# Patient Record
Sex: Male | Born: 1970 | Race: Black or African American | Hispanic: No | Marital: Single | Smoking: Former smoker
Health system: Southern US, Community
[De-identification: ages and names within clinical notes are randomized; demographics above are authoritative.]

---

## 2007-03-13 ENCOUNTER — Emergency Department (HOSPITAL_COMMUNITY): Admission: EM | Admit: 2007-03-13 | Discharge: 2007-03-13 | Payer: Self-pay | Admitting: Emergency Medicine

## 2010-08-30 ENCOUNTER — Emergency Department (HOSPITAL_COMMUNITY)
Admission: EM | Admit: 2010-08-30 | Discharge: 2010-08-30 | Disposition: A | Discharge status: Home / Self Care | Payer: Self-pay | Attending: Emergency Medicine | Admitting: Emergency Medicine

## 2010-08-30 DIAGNOSIS — L299 Pruritus, unspecified: Secondary | ICD-10-CM | POA: Insufficient documentation

## 2010-08-30 DIAGNOSIS — B86 Scabies: Secondary | ICD-10-CM | POA: Insufficient documentation

## 2012-08-14 ENCOUNTER — Encounter (HOSPITAL_COMMUNITY)
Admission: EM | Disposition: A | Discharge status: Home / Self Care | Payer: Self-pay | Source: Home / Self Care | Attending: Emergency Medicine

## 2012-08-14 ENCOUNTER — Observation Stay (HOSPITAL_COMMUNITY)
Admission: EM | Admit: 2012-08-14 | Discharge: 2012-08-15 | Disposition: A | Discharge status: Home / Self Care | Payer: MEDICAID | Attending: Orthopedic Surgery | Admitting: Orthopedic Surgery

## 2012-08-14 ENCOUNTER — Emergency Department (HOSPITAL_COMMUNITY): Payer: Self-pay

## 2012-08-14 ENCOUNTER — Encounter (HOSPITAL_COMMUNITY): Payer: Self-pay | Admitting: Emergency Medicine

## 2012-08-14 ENCOUNTER — Emergency Department (HOSPITAL_COMMUNITY): Payer: Self-pay | Admitting: Anesthesiology

## 2012-08-14 ENCOUNTER — Encounter (HOSPITAL_COMMUNITY): Payer: Self-pay | Admitting: Anesthesiology

## 2012-08-14 DIAGNOSIS — X500XXA Overexertion from strenuous movement or load, initial encounter: Secondary | ICD-10-CM | POA: Insufficient documentation

## 2012-08-14 DIAGNOSIS — S82842A Displaced bimalleolar fracture of left lower leg, initial encounter for closed fracture: Secondary | ICD-10-CM

## 2012-08-14 DIAGNOSIS — S82843A Displaced bimalleolar fracture of unspecified lower leg, initial encounter for closed fracture: Principal | ICD-10-CM

## 2012-08-14 HISTORY — PX: ORIF ANKLE FRACTURE: SHX5408

## 2012-08-14 LAB — CBC WITH DIFFERENTIAL/PLATELET
Basophils Absolute: 0 10*3/uL (ref 0.0–0.1)
Lymphocytes Relative: 16 % (ref 12–46)
Lymphs Abs: 1.4 10*3/uL (ref 0.7–4.0)
Neutrophils Relative %: 74 % (ref 43–77)
Platelets: 158 10*3/uL (ref 150–400)
RBC: 4.67 MIL/uL (ref 4.22–5.81)
WBC: 9.2 10*3/uL (ref 4.0–10.5)

## 2012-08-14 LAB — COMPREHENSIVE METABOLIC PANEL
ALT: 14 U/L (ref 0–53)
AST: 18 U/L (ref 0–37)
Alkaline Phosphatase: 48 U/L (ref 39–117)
CO2: 27 mEq/L (ref 19–32)
GFR calc Af Amer: >90 mL/min (ref >90)
Glucose, Bld: 89 mg/dL (ref 70–99)
Potassium: 3.4 mEq/L — ABNORMAL LOW (ref 3.5–5.1)
Sodium: 140 mEq/L (ref 135–145)
Total Protein: 6.5 g/dL (ref 6.0–8.3)

## 2012-08-14 SURGERY — OPEN REDUCTION INTERNAL FIXATION (ORIF) ANKLE FRACTURE
Anesthesia: General | Laterality: Left | Wound class: Clean

## 2012-08-14 MED ORDER — PROPOFOL 10 MG/ML IV BOLUS
INTRAVENOUS | Status: AC | PRN
  Administered 2012-08-14: 60 mg via INTRAVENOUS
  Administered 2012-08-14: 20 mg via INTRAVENOUS
  Administered 2012-08-14: 100 mg via INTRAVENOUS

## 2012-08-14 MED ORDER — MEPERIDINE HCL 50 MG/ML IJ SOLN
12.5000 mg | Freq: Once | INTRAMUSCULAR | Status: AC
  Administered 2012-08-14: 12.5 mg via INTRAVENOUS

## 2012-08-14 MED ORDER — ENOXAPARIN SODIUM 40 MG/0.4ML ~~LOC~~ SOLN
40.0000 mg | SUBCUTANEOUS | Status: DC
  Administered 2012-08-15: 40 mg via SUBCUTANEOUS
  Filled 2012-08-14 (×2): qty 0.4

## 2012-08-14 MED ORDER — METOCLOPRAMIDE HCL 5 MG/ML IJ SOLN
INTRAMUSCULAR | Status: DC | PRN
  Administered 2012-08-14: 10 mg via INTRAVENOUS

## 2012-08-14 MED ORDER — HYDROMORPHONE HCL PF 1 MG/ML IJ SOLN: 0.2500 mg | INTRAMUSCULAR | Status: DC | PRN

## 2012-08-14 MED ORDER — CEFAZOLIN SODIUM-DEXTROSE 2-3 GM-% IV SOLR
2.0000 g | INTRAVENOUS | Status: AC
  Administered 2012-08-14: 2 g via INTRAVENOUS

## 2012-08-14 MED ORDER — METHOCARBAMOL 500 MG PO TABS
500.0000 mg | ORAL_TABLET | Freq: Four times a day (QID) | ORAL | Status: DC | PRN
  Administered 2012-08-14 – 2012-08-15 (×2): 500 mg via ORAL
  Filled 2012-08-14 (×2): qty 1

## 2012-08-14 MED ORDER — MORPHINE SULFATE 10 MG/ML IJ SOLN
1.0000 mg | INTRAMUSCULAR | Status: DC | PRN
  Administered 2012-08-15: 2 mg via INTRAVENOUS
  Filled 2012-08-14: qty 1

## 2012-08-14 MED ORDER — CITRIC ACID-SODIUM CITRATE 334-500 MG/5ML PO SOLN
ORAL | Status: AC
  Filled 2012-08-14: qty 15

## 2012-08-14 MED ORDER — ONDANSETRON HCL 4 MG/2ML IJ SOLN
INTRAMUSCULAR | Status: DC | PRN
  Administered 2012-08-14 (×2): 2 mg via INTRAVENOUS

## 2012-08-14 MED ORDER — PROPOFOL 10 MG/ML IV BOLUS
INTRAVENOUS | Status: DC | PRN
  Administered 2012-08-14: 180 mg via INTRAVENOUS

## 2012-08-14 MED ORDER — CISATRACURIUM BESYLATE (PF) 10 MG/5ML IV SOLN
INTRAVENOUS | Status: DC | PRN
  Administered 2012-08-14: 4 mg via INTRAVENOUS

## 2012-08-14 MED ORDER — SODIUM CHLORIDE 0.9 % IV SOLN: INTRAVENOUS | Status: DC

## 2012-08-14 MED ORDER — METHOCARBAMOL 100 MG/ML IJ SOLN
500.0000 mg | Freq: Four times a day (QID) | INTRAVENOUS | Status: DC | PRN
  Filled 2012-08-14: qty 5

## 2012-08-14 MED ORDER — FENTANYL CITRATE 0.05 MG/ML IJ SOLN
INTRAMUSCULAR | Status: DC | PRN
  Administered 2012-08-14: 100 ug via INTRAVENOUS
  Administered 2012-08-14: 25 ug via INTRAVENOUS
  Administered 2012-08-14: 50 ug via INTRAVENOUS

## 2012-08-14 MED ORDER — METOCLOPRAMIDE HCL 5 MG PO TABS
5.0000 mg | ORAL_TABLET | Freq: Three times a day (TID) | ORAL | Status: DC | PRN
  Filled 2012-08-14: qty 2

## 2012-08-14 MED ORDER — MIDAZOLAM HCL 5 MG/5ML IJ SOLN
INTRAMUSCULAR | Status: DC | PRN
  Administered 2012-08-14: 2 mg via INTRAVENOUS

## 2012-08-14 MED ORDER — PROMETHAZINE HCL 25 MG/ML IJ SOLN: 6.2500 mg | INTRAMUSCULAR | Status: DC | PRN

## 2012-08-14 MED ORDER — MEPERIDINE HCL 50 MG/ML IJ SOLN
INTRAMUSCULAR | Status: AC
  Filled 2012-08-14: qty 1

## 2012-08-14 MED ORDER — ONDANSETRON HCL 4 MG/2ML IJ SOLN: 4.0000 mg | Freq: Four times a day (QID) | INTRAMUSCULAR | Status: DC | PRN

## 2012-08-14 MED ORDER — PROPOFOL 10 MG/ML IV BOLUS
200.0000 mg | Freq: Once | INTRAVENOUS | Status: AC
  Administered 2012-08-14: 200 mg via INTRAVENOUS
  Filled 2012-08-14: qty 1

## 2012-08-14 MED ORDER — TEMAZEPAM 15 MG PO CAPS: 15.0000 mg | ORAL_CAPSULE | Freq: Every evening | ORAL | Status: DC | PRN

## 2012-08-14 MED ORDER — SUCCINYLCHOLINE CHLORIDE 20 MG/ML IJ SOLN
INTRAMUSCULAR | Status: DC | PRN
  Administered 2012-08-14: 100 mg via INTRAVENOUS

## 2012-08-14 MED ORDER — LORAZEPAM 2 MG/ML IJ SOLN: 1.0000 mg | Freq: Four times a day (QID) | INTRAMUSCULAR | Status: DC | PRN

## 2012-08-14 MED ORDER — 0.9 % SODIUM CHLORIDE (POUR BTL) OPTIME
TOPICAL | Status: DC | PRN
  Administered 2012-08-14: 1000 mL

## 2012-08-14 MED ORDER — KETAMINE HCL 10 MG/ML IJ SOLN
INTRAMUSCULAR | Status: DC | PRN
  Administered 2012-08-14: 20 mg via INTRAVENOUS

## 2012-08-14 MED ORDER — CITRIC ACID-SODIUM CITRATE 334-500 MG/5ML PO SOLN
ORAL | Status: DC | PRN
  Administered 2012-08-14: 30 mL via ORAL

## 2012-08-14 MED ORDER — LACTATED RINGERS IV SOLN
INTRAVENOUS | Status: DC | PRN
  Administered 2012-08-14 (×2): via INTRAVENOUS

## 2012-08-14 MED ORDER — CEFAZOLIN SODIUM 1-5 GM-% IV SOLN
1.0000 g | Freq: Four times a day (QID) | INTRAVENOUS | Status: AC
  Administered 2012-08-15 (×3): 1 g via INTRAVENOUS
  Filled 2012-08-14 (×3): qty 50

## 2012-08-14 MED ORDER — FENTANYL CITRATE 0.05 MG/ML IJ SOLN: 100.0000 ug | Freq: Once | INTRAMUSCULAR | Status: DC

## 2012-08-14 MED ORDER — OXYCODONE HCL 5 MG PO TABS
5.0000 mg | ORAL_TABLET | ORAL | Status: DC | PRN
  Administered 2012-08-14 – 2012-08-15 (×3): 10 mg via ORAL
  Filled 2012-08-14 (×3): qty 2

## 2012-08-14 MED ORDER — METOCLOPRAMIDE HCL 5 MG/ML IJ SOLN: 5.0000 mg | Freq: Three times a day (TID) | INTRAMUSCULAR | Status: DC | PRN

## 2012-08-14 MED ORDER — ONDANSETRON HCL 4 MG PO TABS
4.0000 mg | ORAL_TABLET | Freq: Four times a day (QID) | ORAL | Status: DC | PRN
  Filled 2012-08-14: qty 1

## 2012-08-14 MED ORDER — CEFAZOLIN SODIUM-DEXTROSE 2-3 GM-% IV SOLR
INTRAVENOUS | Status: AC
  Filled 2012-08-14: qty 50

## 2012-08-14 SURGICAL SUPPLY — 45 items
BAG ZIPLOCK 12X15 (MISCELLANEOUS) ×2 IMPLANT
BANDAGE ELASTIC 4 VELCRO ST LF (GAUZE/BANDAGES/DRESSINGS) ×2 IMPLANT
BANDAGE ELASTIC 6 VELCRO ST LF (GAUZE/BANDAGES/DRESSINGS) ×2 IMPLANT
BIT DRILL 2.5X2.75 QC CALB (BIT) ×2 IMPLANT
BIT DRILL 3.5X5.5 QC CALB (BIT) ×2 IMPLANT
CLOTH BEACON ORANGE TIMEOUT ST (SAFETY) ×2 IMPLANT
CUFF TOURN SGL QUICK 34 (TOURNIQUET CUFF) ×1
CUFF TRNQT CYL 34X4X40X1 (TOURNIQUET CUFF) ×1 IMPLANT
DRAPE C-ARM 42X120 X-RAY (DRAPES) ×2 IMPLANT
DRAPE U-SHAPE 47X51 STRL (DRAPES) ×2 IMPLANT
DRSG ADAPTIC 3X8 NADH LF (GAUZE/BANDAGES/DRESSINGS) ×2 IMPLANT
DRSG EMULSION OIL 3X16 NADH (GAUZE/BANDAGES/DRESSINGS) ×2 IMPLANT
DRSG PAD ABDOMINAL 8X10 ST (GAUZE/BANDAGES/DRESSINGS) ×2 IMPLANT
DURAPREP 26ML APPLICATOR (WOUND CARE) ×2 IMPLANT
ELECT REM PT RETURN 9FT ADLT (ELECTROSURGICAL) ×2
ELECTRODE REM PT RTRN 9FT ADLT (ELECTROSURGICAL) ×1 IMPLANT
GLOVE BIO SURGEON STRL SZ7.5 (GLOVE) ×2 IMPLANT
GLOVE BIO SURGEON STRL SZ8 (GLOVE) ×2 IMPLANT
GOWN STRL NON-REIN LRG LVL3 (GOWN DISPOSABLE) ×2 IMPLANT
GOWN STRL REIN XL XLG (GOWN DISPOSABLE) ×2 IMPLANT
K-WIRE 1.6MM THD TROCAR TIP NS (WIRE) ×4
KIT BASIN OR (CUSTOM PROCEDURE TRAY) ×2 IMPLANT
KWIRE 1.6MM THD TROCAR TIP NS (WIRE) ×2 IMPLANT
MANIFOLD NEPTUNE II (INSTRUMENTS) ×2 IMPLANT
NS IRRIG 1000ML POUR BTL (IV SOLUTION) ×2 IMPLANT
PACK LOWER EXTREMITY WL (CUSTOM PROCEDURE TRAY) ×2 IMPLANT
PAD CAST 4YDX4 CTTN HI CHSV (CAST SUPPLIES) ×1 IMPLANT
PADDING CAST COTTON 4X4 STRL (CAST SUPPLIES) ×1
PLATE ACE 100DEG 7HOLE (Plate) ×2 IMPLANT
POSITIONER SURGICAL ARM (MISCELLANEOUS) ×2 IMPLANT
SCREW ACE CAN 4.0 50M (Screw) ×4 IMPLANT
SCREW CORTICAL 3.5MM  12MM (Screw) ×3 IMPLANT
SCREW CORTICAL 3.5MM 12MM (Screw) ×3 IMPLANT
SCREW CORTICAL 3.5MM 14MM (Screw) ×2 IMPLANT
SCREW NLOCK CANC HEX 4X18 (Screw) ×2 IMPLANT
SCREW NLOCK CANC HEX 4X18 FIB (Screw) ×2 IMPLANT
SPONGE GAUZE 4X4 12PLY (GAUZE/BANDAGES/DRESSINGS) ×2 IMPLANT
STAPLER VISISTAT 35W (STAPLE) ×2 IMPLANT
STRIP CLOSURE SKIN 1/2X4 (GAUZE/BANDAGES/DRESSINGS) ×2 IMPLANT
SUT MNCRL AB 4-0 PS2 18 (SUTURE) ×2 IMPLANT
SUT VIC AB 1 CT1 27 (SUTURE) ×2
SUT VIC AB 1 CT1 27XBRD ANTBC (SUTURE) ×2 IMPLANT
SUT VIC AB 2-0 CT1 27 (SUTURE) ×2
SUT VIC AB 2-0 CT1 TAPERPNT 27 (SUTURE) ×2 IMPLANT
TOWEL OR 17X26 10 PK STRL BLUE (TOWEL DISPOSABLE) ×4 IMPLANT

## 2012-08-14 NOTE — H&P (Signed)
Jared Romero is an 42 y.o. male.   Chief Complaint: Left ankle pain HPI: 42 yo male who was "messing around" in his yard earlier today when he slipped and fell twisting his left ankle with immediate left ankle pain and deformity. No numbness or tingling. Unable to get up and bear weight. Was drinking alcohol earlier today- "a couple of beers and some wine and gin". He is not hurting anywhere besides his ankle.He has had a previous injury to this ankle and was told it was "broke but not fractured" . He did not require surgery with that last injury. He presented with a fracture dislocation, which Dr. Ranae Palms reduced but there was still residual subluxation. I was called to see patient for possible surgical fixation.  History reviewed. No pertinent past medical history.  History reviewed. No pertinent past surgical history.  No family history on file. Social History:  reports that he has been smoking.  He does not have any smokeless tobacco history on file. He reports that  drinks alcohol. His drug history is not on file.  Allergies: No Known Allergies   (Not in a hospital admission)  No results found for this or any previous visit (from the past 48 hour(s)). Dg Ankle Complete Left  08/14/2012   *RADIOLOGY REPORT*  Clinical Data: Fracture dislocation of the left ankle.  LEFT ANKLE COMPLETE - 3+ VIEW  Comparison: Radiographs dated 08/14/2012  Findings: There has been reduction of the dislocation at the ankle. There is slight displacement of the medial and lateral malleolar fractures and there is slight lateral subluxation of the foot with respect to the distal tibia.  Medial joint space is widened at the ankle.  IMPRESSION: Reduction of the dislocation.  Improved alignment and position of the medial and lateral malleolar fractures as described.   Original Report Authenticated By: Francene Boyers, M.D.   Dg Ankle Complete Left  08/14/2012   *RADIOLOGY REPORT*  Clinical Data: Trauma with deformity  LEFT  ANKLE COMPLETE - 3+ VIEW  Comparison: None.  Findings: There is extensive fracture dislocation at the ankle joint.  There is complete dislocation of the articular surface of the tibia from the talus.  The talus is displaced laterally.  There is a fracture of the medial malleolus.  There is probably a fracture of the posterior lip of the tibia.  There is an oblique fracture of the distal fibula.  IMPRESSION: Fracture dislocation at the ankle joint as described.   Original Report Authenticated By: Paulina Fusi, M.D.    Review of Systems  Constitutional: Negative.   HENT: Negative.   Eyes: Negative.   Respiratory: Negative.   Cardiovascular: Negative.   Gastrointestinal: Negative.   Genitourinary: Negative.   Musculoskeletal: Positive for joint pain.       Left ankle pain   Skin: Negative.   Neurological: Negative.   Endo/Heme/Allergies: Negative.   Psychiatric/Behavioral: Negative.     Blood pressure 121/59, pulse 95, temperature 99.6 F (37.6 C), temperature source Oral, resp. rate 21, height 5\' 8"  (1.727 m), weight 165 lb (74.844 kg), SpO2 98.00%. Physical Exam  Physical Examination: General appearance - alert, well appearing, and in no distress Mental status - alert, oriented to person, place, and time Neck - supple, no significant adenopathy Chest - clear to auscultation, no wheezes, rales or rhonchi, symmetric air entry Heart - normal rate, regular rhythm, normal S1, S2, no murmurs, rubs, clicks or gallops Abdomen - soft, nontender, nondistended, no masses or organomegaly Neurological - alert, oriented, normal  speech, no focal findings or movement disorder noted Left lower extremity in short leg splint- wiggles toes, sensation intact, Cap refill , 3 secs  Assessment/Plan Left bimalleolar ankle fracture/dislocation- Plan ORIF as this is an unstable injury with an incongruent joint. Discussed procedure,risks, potential complications and rehab course with patient who elects to  proceed.  Loanne Drilling 08/14/2012, 4:38 PM

## 2012-08-14 NOTE — ED Notes (Signed)
Per ems: pt from home, was drinking alcohol in his yard today, stepped into hole and twisted left ankle. Left ankle deformity noted. Pt does not appear intoxicated. No LOC.18 gauge placed in left ac. bp 132/82, pulse 84, GCS 15.

## 2012-08-14 NOTE — ED Provider Notes (Signed)
CSN: [id number]     Arrival date & time 08/14/12  1322 History     First MD Initiated Contact with Patient 08/14/12 1325     Chief Complaint  Patient presents with  . Leg Injury    ankle   (Consider location/radiation/quality/duration/timing/severity/associated sxs/prior Treatment) HPI Pt with L ankle injury requiring surgery 1 year ago after stepping in a hole. Pt now states the ankle must not have healed properly because he stepped into another hole and his ankle is now severely deformed. Pt denies other injury. No pain currently. Pt has no numbness. Splint placed by EMS. Pt last ate yesterday and drank small amount earlier this AM.  History reviewed. No pertinent past medical history. Past Surgical History  Procedure Laterality Date  . Orif ankle fracture Left 08/14/2012    Procedure: OPEN REDUCTION INTERNAL FIXATION (ORIF) ANKLE FRACTURE;  Surgeon: Loanne Drilling, MD;  Location: [facility];  Service: Orthopedics;  Laterality: Left;   No family history on file. History  Substance Use Topics  . Smoking status: Current Every Day Smoker  . Smokeless tobacco: Not on file  . Alcohol Use: Yes    Review of Systems  HENT: Negative for neck pain.   Cardiovascular: Negative for chest pain.  Gastrointestinal: Negative for nausea, vomiting and abdominal pain.  Musculoskeletal: Positive for joint swelling.  Neurological: Negative for dizziness, weakness, numbness and headaches.  All other systems reviewed and are negative.    Allergies  Review of patient's allergies indicates no known allergies.  Home Medications   Current Outpatient Rx  Name  Route  Sig  Dispense  Refill  . methocarbamol (ROBAXIN) 500 MG tablet   Oral   Take 1 tablet (500 mg total) by mouth every 6 (six) hours as needed.   80 tablet   0   . oxyCODONE (OXY IR/ROXICODONE) 5 MG immediate release tablet   Oral   Take 1-2 tablets (5-10 mg total) by mouth every 3 (three) hours as needed.   80 tablet   0    BP  151/106  Pulse 78  Temp(Src) 98 F (36.7 C) (Oral)  Resp 20  Ht 5\' 8"  (1.727 m)  Wt 165 lb (74.844 kg)  BMI 25.09 kg/m2  SpO2 56% Physical Exam  Nursing note and vitals reviewed. Constitutional: He is oriented to person, place, and time. He appears well-developed and well-nourished. No distress.  HENT:  Head: Normocephalic and atraumatic.  Mouth/Throat: Oropharynx is clear and moist.  Eyes: EOM are normal. Pupils are equal, round, and reactive to light.  Neck: Normal range of motion. Neck supple.  No posterior cervical tenderness  Cardiovascular: Normal rate and regular rhythm.   Pulmonary/Chest: Effort normal and breath sounds normal. No respiratory distress. He has no wheezes. He has no rales.  Abdominal: Soft. Bowel sounds are normal. There is no tenderness. There is no rebound and no guarding.  Musculoskeletal: He exhibits no edema and no tenderness.  Grossly deformed L ankle with foot angled 90 degrees laterally. 2+ DP, foot warm. No prox fib tenderness or knee pain  Neurological: He is alert and oriented to person, place, and time.  Sensation intact, moves all toes  Skin: Skin is warm and dry. No rash noted. No erythema.  Psychiatric: He has a normal mood and affect. His behavior is normal.    ED Course   Reduction of fracture Date/Time: 08/14/2012 3:25 PM Performed by: Loren Racer Authorized by: Ranae Palms, Kaileah Shevchenko Consent: written consent obtained. Risks and benefits: risks, benefits  and alternatives were discussed Consent given by: patient Imaging studies: imaging studies available Patient identity confirmed: verbally with patient and arm band Time out: Immediately prior to procedure a "time out" was called to verify the correct patient, procedure, equipment, support staff and site/side marked as required. Local anesthesia used: no Patient sedated: yes Sedatives: propofol and see MAR for details Sedation start date/time: 08/14/2012 3:00 PM Sedation end date/time:  08/14/2012 3:15 PM Patient tolerance: Patient tolerated the procedure well with no immediate complications. Comments: Good alignment. 2+ DP. Good cap refill.      (including critical care time)  Labs Reviewed  COMPREHENSIVE METABOLIC PANEL - Abnormal; Notable for the following:    Potassium 3.4 (*)    Calcium 8.3 (*)    Albumin 3.2 (*)    Total Bilirubin 0.2 (*)    All other components within normal limits  CBC WITH DIFFERENTIAL   Dg Ankle Complete Left  08/14/2012   *RADIOLOGY REPORT*  Clinical Data: ORIF of the left ankle  DG C-ARM 1-60 MIN - NRPT [facility], LEFT ANKLE COMPLETE - 3+ VIEW  Comparison:  Left ankle radiographs - 08/14/2012  Findings:  Three spot intraoperative radiographic images of the left ankle are provided for review.  Images demonstrate lag screw fixation of previously noted medial malleolar fracture.  Post side plate and transfixing cancellous screw fixation of previously noted displaced distal fibular fracture.  Alignment appears near anatomic.  There is a minimal amount of expected subcutaneous emphysema about the operative site.  No radiopaque foreign body.  IMPRESSION: Post ORIF of medial and lateral malleolar ankle fractures without evidence of complication.   Original Report Authenticated By: Tacey Ruiz, MD   Dg Ankle Complete Left  08/14/2012   *RADIOLOGY REPORT*  Clinical Data: Fracture dislocation of the left ankle.  LEFT ANKLE COMPLETE - 3+ VIEW  Comparison: Radiographs dated 08/14/2012  Findings: There has been reduction of the dislocation at the ankle. There is slight displacement of the medial and lateral malleolar fractures and there is slight lateral subluxation of the foot with respect to the distal tibia.  Medial joint space is widened at the ankle.  IMPRESSION: Reduction of the dislocation.  Improved alignment and position of the medial and lateral malleolar fractures as described.   Original Report Authenticated By: Francene Boyers, M.D.   Dg C-arm 1-60 Min-no  Report  08/14/2012   *RADIOLOGY REPORT*  Clinical Data: ORIF of the left ankle  DG C-ARM 1-60 MIN - [facility], LEFT ANKLE COMPLETE - 3+ VIEW  Comparison:  Left ankle radiographs - 08/14/2012  Findings:  Three spot intraoperative radiographic images of the left ankle are provided for review.  Images demonstrate lag screw fixation of previously noted medial malleolar fracture.  Post side plate and transfixing cancellous screw fixation of previously noted displaced distal fibular fracture.  Alignment appears near anatomic.  There is a minimal amount of expected subcutaneous emphysema about the operative site.  No radiopaque foreign body.  IMPRESSION: Post ORIF of medial and lateral malleolar ankle fractures without evidence of complication.   Original Report Authenticated By: Tacey Ruiz, MD   1. Fracture of ankle, bimalleolar, closed, left, initial encounter     MDM  Discussed With Dr Lequita Halt. Call back if not successfully reduced.   Dr Lequita Halt to see in ED. Keep NPO.   Loren Racer, MD 08/16/12 [phone]

## 2012-08-14 NOTE — Brief Op Note (Signed)
08/14/2012  6:59 PM  PATIENT:  Jared Romero  42 y.o. male  PRE-OPERATIVE DIAGNOSIS:  left ankle fracture  POST-OPERATIVE DIAGNOSIS:  left ankle fracture  PROCEDURE:  Procedure(s): OPEN REDUCTION INTERNAL FIXATION (ORIF) ANKLE FRACTURE (Left)  SURGEON:  Surgeon(s) and Role:    * Loanne Drilling, MD - Primary  PHYSICIAN ASSISTANT:   ASSISTANTS: Avel Peace, PA-C   ANESTHESIA:   general  EBL:     LOCAL MEDICATIONS USED:  NONE  COUNTS:  YES  TOURNIQUET:   Total Tourniquet Time Documented: Thigh (Left) - 28 minutes Total: Thigh (Left) - 28 minutes   DICTATION: .Other Dictation: Dictation Number [id number]  PLAN OF CARE: Admit for overnight observation  PATIENT DISPOSITION:  PACU - hemodynamically stable.

## 2012-08-14 NOTE — Transfer of Care (Signed)
Immediate Anesthesia Transfer of Care Note  Patient: Jared Romero  Procedure(s) Performed: Procedure(s): OPEN REDUCTION INTERNAL FIXATION (ORIF) ANKLE FRACTURE (Left)  Patient Location: PACU  Anesthesia Type:General  Level of Consciousness: awake, alert , oriented and patient cooperative  Airway & Oxygen Therapy: Patient Spontanous Breathing and Patient connected to face mask oxygen  Post-op Assessment: Report given to PACU RN and Post -op Vital signs reviewed and stable  Post vital signs: Reviewed and stable  Complications: No apparent anesthesia complications

## 2012-08-14 NOTE — Interval H&P Note (Signed)
History and Physical Interval Note:  08/14/2012 6:07 PM  Jared Romero  has presented today for surgery, with the diagnosis of left ankle fracture  The various methods of treatment have been discussed with the patient and family. After consideration of risks, benefits and other options for treatment, the patient has consented to  Procedure(s): OPEN REDUCTION INTERNAL FIXATION (ORIF) ANKLE FRACTURE (Left) as a surgical intervention .  The patient's history has been reviewed, patient examined, no change in status, stable for surgery.  I have reviewed the patient's chart and labs.  Questions were answered to the patient's satisfaction.     Loanne Drilling

## 2012-08-14 NOTE — Anesthesia Preprocedure Evaluation (Signed)
Anesthesia Evaluation  Patient identified by MRN, date of birth, ID band Patient awake    Reviewed: Allergy & Precautions, H&P , NPO status , Patient's Chart, lab work & pertinent test results  Airway Mallampati: II TM Distance: >3 FB Neck ROM: Full    Dental no notable dental hx.    Pulmonary neg pulmonary ROS, Current Smoker,  breath sounds clear to auscultation  Pulmonary exam normal       Cardiovascular Exercise Tolerance: Good negative cardio ROS  Rhythm:Regular Rate:Normal     Neuro/Psych negative neurological ROS  negative psych ROS   GI/Hepatic negative GI ROS, Neg liver ROS,   Endo/Other  negative endocrine ROS  Renal/GU negative Renal ROS  negative genitourinary   Musculoskeletal negative musculoskeletal ROS (+)   Abdominal   Peds negative pediatric ROS (+)  Hematology negative hematology ROS (+)   Anesthesia Other Findings   Reproductive/Obstetrics negative OB ROS                           Anesthesia Physical Anesthesia Plan  ASA: II and emergent  Anesthesia Plan: General   Post-op Pain Management:    Induction: Intravenous  Airway Management Planned: Oral ETT  Additional Equipment:   Intra-op Plan:   Post-operative Plan: Extubation in OR  Informed Consent: I have reviewed the patients History and Physical, chart, labs and discussed the procedure including the risks, benefits and alternatives for the proposed anesthesia with the patient or authorized representative who has indicated his/her understanding and acceptance.   Dental advisory given  Plan Discussed with: CRNA  Anesthesia Plan Comments:         Anesthesia Quick Evaluation

## 2012-08-14 NOTE — Anesthesia Postprocedure Evaluation (Signed)
  Anesthesia Post-op Note  Patient: Jared Romero  Procedure(s) Performed: Procedure(s) (LRB): OPEN REDUCTION INTERNAL FIXATION (ORIF) ANKLE FRACTURE (Left)  Patient Location: PACU  Anesthesia Type: General  Level of Consciousness: awake and alert   Airway and Oxygen Therapy: Patient Spontanous Breathing  Post-op Pain: mild  Post-op Assessment: Post-op Vital signs reviewed, Patient's Cardiovascular Status Stable, Respiratory Function Stable, Patent Airway and No signs of Nausea or vomiting  Last Vitals:  Filed Vitals:   08/14/12 2054  BP: 133/70  Pulse: 94  Temp: 38 C  Resp: 20    Post-op Vital Signs: stable   Complications: No apparent anesthesia complications

## 2012-08-14 NOTE — ED Notes (Signed)
[id number]<ON> Expected date:<BR> Expected time:<BR> Means of arrival:<BR> Comments:<BR> EMS ankle deformity

## 2012-08-15 MED ORDER — MORPHINE SULFATE 2 MG/ML IJ SOLN: 1.0000 mg | INTRAMUSCULAR | Status: DC | PRN

## 2012-08-15 MED ORDER — METHOCARBAMOL 500 MG PO TABS: 500.0000 mg | ORAL_TABLET | Freq: Four times a day (QID) | ORAL | Status: DC | PRN

## 2012-08-15 MED ORDER — OXYCODONE HCL 5 MG PO TABS: 5.0000 mg | ORAL_TABLET | ORAL | Status: DC | PRN

## 2012-08-15 NOTE — Evaluation (Signed)
Physical Therapy Evaluation Patient Details Name: Jared Romero MRN: [id number] DOB: 30-Aug-1970 Today's Date: 08/15/2012 Time: 7829-5621 PT Time Calculation (min): 22 min  PT Assessment / Plan / Recommendation History of Present Illness  42 yo male admitted with fx left ankle who underwent ORIF 08/14/12  Pt is TDWB  Clinical Impression  Pt is independent with RW on level surfaces and supervision on steps to enter home. He is more comfortable walking and maintaining limited weight bearing with RW and can borrow one from his father at home.  Crutches had been issued and were adjusted for pt to use once his weight bearing status is increased. Pt acknowledged understanding of all teaching and had no further questions.    PT Assessment  Patent does not need any further PT services    Follow Up Recommendations  No PT follow up    Does the patient have the potential to tolerate intense rehabilitation      Barriers to Discharge        Equipment Recommendations  None recommended by PT    Recommendations for Other Services     Frequency      Precautions / Restrictions Restrictions Weight Bearing Restrictions: Yes RLE Weight Bearing: Touchdown weight bearing (L LE) LLE Weight Bearing: Touchdown weight bearing Other Position/Activity Restrictions: Pt is in a CAM boot for immobilization   Pertinent Vitals/Pain Pt with some pain in left ankle. He had received pain med prior to treatment      Mobility  Bed Mobility Bed Mobility: Supine to Sit Supine to Sit: 6: Modified independent (Device/Increase time) Details for Bed Mobility Assistance: pt moves slowly Transfers Transfers: Sit to Stand;Stand to Sit Sit to Stand: 6: Modified independent (Device/Increase time) Stand to Sit: 6: Modified independent (Device/Increase time) Details for Transfer Assistance: cues to push up with arms, pt moves slowly Ambulation/Gait Ambulation/Gait Assistance: 6: Modified independent (Device/Increase  time) Ambulation Distance (Feet): 50 Feet Assistive device: Rolling walker;Crutches Ambulation/Gait Assistance Details: cues for posture.   Gait Pattern: Step-to pattern Gait velocity: decreased General Gait Details: Pt prefers to use RW because he feels safer.  He is able to safely limit his weight bearing to TDWB and even NWB today Stairs: Yes Stairs Assistance: 5: Supervision Stairs Assistance Details (indicate cue type and reason): supervision for safety Stair Management Technique: No rails;Backwards;With walker Number of Stairs: 2    Exercises     PT Diagnosis:    PT Problem List:   PT Treatment Interventions:       PT Goals(Current goals can be found in the care plan section) Acute Rehab PT Goals Patient Stated Goal: to go home today PT Goal Formulation: No goals set, d/c therapy  Visit Information  Last PT Received On: 08/15/12 Assistance Needed: +1 History of Present Illness: 42 yo male admitted with fx left ankle who underwent ORIF 08/14/12  Pt is TDWB       Prior Functioning  Home Living Family/patient expects to be discharged to:: Private residence Living Arrangements: Spouse/significant other Available Help at Discharge: Family Type of Home: House Home Access: Stairs to enter Secretary/administrator of Steps: 1 Entrance Stairs-Rails: None Home Layout: One level Home Equipment: Environmental consultant - 2 wheels Additional Comments: Pt is able to borrow his father's RW Prior Function Level of Independence: Independent Communication Communication: No difficulties    Cognition  Cognition Arousal/Alertness: Awake/alert Behavior During Therapy: WFL for tasks assessed/performed Overall Cognitive Status: Within Functional Limits for tasks assessed    Extremity/Trunk Assessment Upper Extremity Assessment  Upper Extremity Assessment: Overall WFL for tasks assessed Lower Extremity Assessment Lower Extremity Assessment: LLE deficits/detail LLE Deficits / Details: cam boot on  lower legs. Toes warm and moveable but feel like they are "tingling" Cervical / Trunk Assessment Cervical / Trunk Assessment: Normal   Balance Balance Balance Assessed: Yes Static Sitting Balance Static Sitting - Balance Support: No upper extremity supported Static Sitting - Level of Assistance: 7: Independent Static Standing Balance Static Standing - Balance Support: Bilateral upper extremity supported Static Standing - Level of Assistance: 6: Modified independent (Device/Increase time)  End of Session PT - End of Session Activity Tolerance: Patient tolerated treatment well Patient left: in chair;with family/visitor present Nurse Communication: Mobility status  GP Functional Assessment Tool Used: clinical judgement Functional Limitation: Mobility: Walking and moving around Mobility: Walking and Moving Around Current Status [phone]): At least 1 percent but less than 20 percent impaired, limited or restricted Mobility: Walking and Moving Around Goal Status [phone]): At least 1 percent but less than 20 percent impaired, limited or restricted Mobility: Walking and Moving Around Discharge Status [phone]): At least 1 percent but less than 20 percent impaired, limited or restricted    Jared Romero, [place] [phone] 08/15/2012, 11:56 AM

## 2012-08-15 NOTE — Care Management Note (Signed)
    Page 1 of 1   08/15/2012     3:24:48 PM   CARE MANAGEMENT NOTE 08/15/2012  Patient:  Jared Romero, Jared Romero   Account Number:  [id number]  Date Initiated:  08/15/2012  Documentation initiated by:  Lorenda Ishihara  Subjective/Objective Assessment:   42 yo male admitted s/p ORIF of ankle. PTA lived at home with spouse.     Action/Plan:   Home when stable   Anticipated DC Date:  08/15/2012   Anticipated DC Plan:  HOME/SELF CARE      DC Planning Services  CM consult      Choice offered to / List presented to:             Status of service:  Completed, signed off Medicare Important Message given?   (If response is "NO", the following Medicare IM given date fields will be blank) Date Medicare IM given:   Date Additional Medicare IM given:    Discharge Disposition:  HOME/SELF CARE  Per UR Regulation:  Reviewed for med. necessity/level of care/duration of stay  If discussed at Long Length of Stay Meetings, dates discussed:    Comments:  No HH needs per PT eval

## 2012-08-15 NOTE — Progress Notes (Signed)
   Subjective: 1 Day Post-Op Procedure(s) (LRB): OPEN REDUCTION INTERNAL FIXATION (ORIF) ANKLE FRACTURE (Left) Patient reports pain as mild.   Patient seen in rounds for Dr. Lequita Halt. Patient is well, but has had some minor complaints of pain in the ankle, requiring pain medications Patient is ready to go home later today.  Objective: Vital signs in last 24 hours: Temp:  [99.2 F (37.3 C)-100.9 F (38.3 C)] 99.2 F (37.3 C) (07/28 0500) Pulse Rate:  [68-118] 68 (07/28 0500) Resp:  [18-31] 18 (07/28 0500) BP: (111-140)/(50-100) 130/83 mmHg (07/28 0500) SpO2:  [92 %-100 %] 96 % (07/28 0500) Weight:  [72.576 kg (160 lb)-74.844 kg (165 lb)] 74.844 kg (165 lb) (07/27 2100)  Intake/Output from previous day:  Intake/Output Summary (Last 24 hours) at 08/15/12 0704 Last data filed at 08/15/12 0600  Gross per 24 hour  Intake   2350 ml  Output   1725 ml  Net    625 ml    Intake/Output this shift:    Labs:  Recent Labs  08/14/12 1715  HGB 13.4    Recent Labs  08/14/12 1715  WBC 9.2  RBC 4.67  HCT 40.6  PLT 158    Recent Labs  08/14/12 1715  NA 140  K 3.4*  CL 105  CO2 27  BUN 8  CREATININE 0.99  GLUCOSE 89  CALCIUM 8.3*   No results found for this basename: LABPT, INR,  in the last 72 hours  EXAM: General - Patient is Alert and Appropriate Extremity - Neurovascular intact Sensation intact distally Dressing - clean, dry Motor Function - intact, moving toes well on exam.  CAM walker in place  Assessment/Plan: 1 Day Post-Op Procedure(s) (LRB): OPEN REDUCTION INTERNAL FIXATION (ORIF) ANKLE FRACTURE (Left) Procedure(s) (LRB): OPEN REDUCTION INTERNAL FIXATION (ORIF) ANKLE FRACTURE (Left) History reviewed. No pertinent past medical history. Principal Problem:   Fracture of ankle, bimalleolar, closed  Estimated body mass index is 25.09 kg/(m^2) as calculated from the following:   Height as of this encounter: 5\' 8"  (1.727 m).   Weight as of this encounter:  74.844 kg (165 lb). Up with therapy Discharge home with home health Diet - Regular diet Follow up - in 1 weeks Activity - NWB Disposition - Home Condition Upon Discharge - Stable D/C Meds - See DC Summary   Jared Romero 08/15/2012, 7:04 AM

## 2012-08-15 NOTE — Progress Notes (Signed)
Pt for d/c home today after therapy session. IV d/c'd. Cam walker boot in place to L LE. Instructed to maintain use of ice to help with pain & swelling. D/C instructions & Rx given with verbalized understanding. Wife at bedside to assist with d/c. PT states that pt does not need PT follow up at home & will use father's walker for ambulation.

## 2012-08-15 NOTE — Discharge Summary (Signed)
Physician Discharge Summary   Patient ID: Jared Romero MRN: [id number] DOB/AGE: 40-26-1972 40 y.o.  Admit date: 08/14/2012 Discharge date: 08/15/2012  Primary Diagnosis:Left bimalleolar ankle fracture/dislocation    Admission Diagnoses:  History reviewed. No pertinent past medical history. Discharge Diagnoses:   Principal Problem:   Fracture of ankle, bimalleolar, closed  Estimated body mass index is 25.09 kg/(m^2) as calculated from the following:   Height as of this encounter: 5\' 8"  (1.727 m).   Weight as of this encounter: 74.844 kg (165 lb).  Procedure:  Procedure(s) (LRB): OPEN REDUCTION INTERNAL FIXATION (ORIF) ANKLE FRACTURE (Left)   Consults: None  HPI: 42 yo male who was "messing around" in his yard earlier today when he slipped and fell twisting his left ankle with immediate left ankle pain and deformity. No numbness or tingling. Unable to get up and bear weight. Was drinking alcohol earlier today- "a couple of beers and some wine and gin". He is not hurting anywhere besides his ankle.He has had a previous injury to this ankle and was told it was "broke but not fractured" . He did not require surgery with that last injury. He presented with a fracture dislocation, which Dr. Ranae Palms reduced but there was still residual subluxation. Dr. Lequita Halt saw the patient and it was decided that he would require surgery.  Laboratory Data: Admission on 08/14/2012  Component Date Value Range Status  . WBC 08/14/2012 9.2  4.0 - 10.5 K/uL Final  . RBC 08/14/2012 4.67  4.22 - 5.81 MIL/uL Final  . Hemoglobin 08/14/2012 13.4  13.0 - 17.0 g/dL Final  . HCT 40/98/1191 40.6  39.0 - 52.0 % Final  . MCV 08/14/2012 86.9  78.0 - 100.0 fL Final  . MCH 08/14/2012 28.7  26.0 - 34.0 pg Final  . MCHC 08/14/2012 33.0  30.0 - 36.0 g/dL Final  . RDW 47/82/9562 14.0  11.5 - 15.5 % Final  . Platelets 08/14/2012 158  150 - 400 K/uL Final  . Neutrophils Relative % 08/14/2012 74  43 - 77 % Final  . Neutro  Abs 08/14/2012 6.8  1.7 - 7.7 K/uL Final  . Lymphocytes Relative 08/14/2012 16  12 - 46 % Final  . Lymphs Abs 08/14/2012 1.4  0.7 - 4.0 K/uL Final  . Monocytes Relative 08/14/2012 10  3 - 12 % Final  . Monocytes Absolute 08/14/2012 0.9  0.1 - 1.0 K/uL Final  . Eosinophils Relative 08/14/2012 1  0 - 5 % Final  . Eosinophils Absolute 08/14/2012 0.1  0.0 - 0.7 K/uL Final  . Basophils Relative 08/14/2012 0  0 - 1 % Final  . Basophils Absolute 08/14/2012 0.0  0.0 - 0.1 K/uL Final  . Sodium 08/14/2012 140  135 - 145 mEq/L Final  . Potassium 08/14/2012 3.4* 3.5 - 5.1 mEq/L Final  . Chloride 08/14/2012 105  96 - 112 mEq/L Final  . CO2 08/14/2012 27  19 - 32 mEq/L Final  . Glucose, Bld 08/14/2012 89  70 - 99 mg/dL Final  . BUN 13/08/6576 8  6 - 23 mg/dL Final  . Creatinine, Ser 08/14/2012 0.99  0.50 - 1.35 mg/dL Final  . Calcium 46/96/2952 8.3* 8.4 - 10.5 mg/dL Final  . Total Protein 08/14/2012 6.5  6.0 - 8.3 g/dL Final  . Albumin 84/13/2440 3.2* 3.5 - 5.2 g/dL Final  . AST 11/15/2534 18  0 - 37 U/L Final  . ALT 08/14/2012 14  0 - 53 U/L Final  . Alkaline Phosphatase 08/14/2012 48  39 -  117 U/L Final  . Total Bilirubin 08/14/2012 0.2* 0.3 - 1.2 mg/dL Final  . GFR calc non Af Amer 08/14/2012 >90  >90 mL/min Final  . GFR calc Af Amer 08/14/2012 >90  >90 mL/min Final   Comment:                                 The eGFR has been calculated                          using the CKD EPI equation.                          This calculation has not been                          validated in all clinical                          situations.                          eGFR's persistently                          <90 mL/min signify                          possible Chronic Kidney Disease.     X-Rays:Dg Ankle Complete Left  08/14/2012   *RADIOLOGY REPORT*  Clinical Data: ORIF of the left ankle  DG C-ARM 1-60 MIN - NRPT [facility], LEFT ANKLE COMPLETE - 3+ VIEW  Comparison:  Left ankle radiographs - 08/14/2012   Findings:  Three spot intraoperative radiographic images of the left ankle are provided for review.  Images demonstrate lag screw fixation of previously noted medial malleolar fracture.  Post side plate and transfixing cancellous screw fixation of previously noted displaced distal fibular fracture.  Alignment appears near anatomic.  There is a minimal amount of expected subcutaneous emphysema about the operative site.  No radiopaque foreign body.  IMPRESSION: Post ORIF of medial and lateral malleolar ankle fractures without evidence of complication.   Original Report Authenticated By: Tacey Ruiz, MD   Dg Ankle Complete Left  08/14/2012   *RADIOLOGY REPORT*  Clinical Data: Fracture dislocation of the left ankle.  LEFT ANKLE COMPLETE - 3+ VIEW  Comparison: Radiographs dated 08/14/2012  Findings: There has been reduction of the dislocation at the ankle. There is slight displacement of the medial and lateral malleolar fractures and there is slight lateral subluxation of the foot with respect to the distal tibia.  Medial joint space is widened at the ankle.  IMPRESSION: Reduction of the dislocation.  Improved alignment and position of the medial and lateral malleolar fractures as described.   Original Report Authenticated By: Francene Boyers, M.D.   Dg Ankle Complete Left  08/14/2012   *RADIOLOGY REPORT*  Clinical Data: Trauma with deformity  LEFT ANKLE COMPLETE - 3+ VIEW  Comparison: None.  Findings: There is extensive fracture dislocation at the ankle joint.  There is complete dislocation of the articular surface of the tibia from the talus.  The talus is displaced laterally.  There is a fracture of the medial malleolus.  There is probably a fracture  of the posterior lip of the tibia.  There is an oblique fracture of the distal fibula.  IMPRESSION: Fracture dislocation at the ankle joint as described.   Original Report Authenticated By: Paulina Fusi, M.D.   Dg C-arm 1-60 Min-no Report  08/14/2012   *RADIOLOGY  REPORT*  Clinical Data: ORIF of the left ankle  DG C-ARM 1-60 MIN - [facility], LEFT ANKLE COMPLETE - 3+ VIEW  Comparison:  Left ankle radiographs - 08/14/2012  Findings:  Three spot intraoperative radiographic images of the left ankle are provided for review.  Images demonstrate lag screw fixation of previously noted medial malleolar fracture.  Post side plate and transfixing cancellous screw fixation of previously noted displaced distal fibular fracture.  Alignment appears near anatomic.  There is a minimal amount of expected subcutaneous emphysema about the operative site.  No radiopaque foreign body.  IMPRESSION: Post ORIF of medial and lateral malleolar ankle fractures without evidence of complication.   Original Report Authenticated By: Tacey Ruiz, MD    EKG:No orders found for this or any previous visit.   Hospital Course: Jared Romero is a 42 y.o. who was admitted to [facility]. They were brought to the operating room on 08/14/2012 and underwent Procedure(s): OPEN REDUCTION INTERNAL FIXATION (ORIF) ANKLE FRACTURE.  Patient tolerated the procedure well and was later transferred to the recovery room and then to the orthopaedic floor for postoperative care.  They were given PO and IV analgesics for pain control following their surgery.  They were given 24 hours of postoperative antibiotics of  Anti-infectives   Start     Dose/Rate Route Frequency Ordered Stop   08/15/12 0000  ceFAZolin (ANCEF) IVPB 1 g/50 mL premix     1 g 100 mL/hr over 30 Minutes Intravenous Every 6 hours 08/14/12 1924 08/15/12 1759   08/14/12 1640  ceFAZolin (ANCEF) IVPB 2 g/50 mL premix     2 g 100 mL/hr over 30 Minutes Intravenous 30 min pre-op 08/14/12 1637 08/14/12 1800     PT ordered for ambulation.  Discharge planning consulted to help with postop disposition and equipment needs.  Patient had a decent night on the evening of surgery.  They started to get up OOB with therapy on day one. He was NWB to the left  leg.   Patient was seen in rounds and was felt that he was ready to go home later that day.   Discharge Medications: Prior to Admission medications   Medication Sig Start Date End Date Taking? Authorizing Provider  methocarbamol (ROBAXIN) 500 MG tablet Take 1 tablet (500 mg total) by mouth every 6 (six) hours as needed. 08/15/12   Alexzandrew Perkins, PA-C  oxyCODONE (OXY IR/ROXICODONE) 5 MG immediate release tablet Take 1-2 tablets (5-10 mg total) by mouth every 3 (three) hours as needed. 08/15/12   Alexzandrew Julien Girt, PA-C   Discharge home with home health  Diet - Regular diet  Follow up - in 1 weeks  Activity - NWB  Disposition - Home  Condition Upon Discharge - Stable   Discharge Orders   Future Orders Complete By Expires     Call MD / Call 911  As directed     Comments:      If you experience chest pain or shortness of breath, CALL 911 and be transported to the hospital emergency room.  If you develope a fever above 101 F, pus (white drainage) or increased drainage or redness at the wound, or calf pain, call your surgeon's  office.    Constipation Prevention  As directed     Comments:      Drink plenty of fluids.  Prune juice may be helpful.  You may use a stool softener, such as Colace (over the counter) 100 mg twice a day.  Use MiraLax (over the counter) for constipation as needed.    Diet general  As directed     Discharge instructions  As directed     Comments:      Pick up stool softner and laxative for home. Do not submerge incision under water. Continue to use ice for pain and swelling from surgery.  Take a Baby 81 mg Aspirin daily for four weeks.    Do not sit on low chairs, stoools or toilet seats, as it may be difficult to get up from low surfaces  As directed     Driving restrictions  As directed     Comments:      No driving until released by the physician.    Increase activity slowly as tolerated  As directed     Lifting restrictions  As directed     Comments:       No lifting until released by the physician.    Patient may shower  As directed     Comments:      You may shower without a dressing once there is no drainage.  Do not wash over the wound.  If drainage remains, do not shower until drainage stops.    Weight bearing as tolerated  As directed         Medication List         methocarbamol 500 MG tablet  Commonly known as:  ROBAXIN  Take 1 tablet (500 mg total) by mouth every 6 (six) hours as needed.     oxyCODONE 5 MG immediate release tablet  Commonly known as:  Oxy IR/ROXICODONE  Take 1-2 tablets (5-10 mg total) by mouth every 3 (three) hours as needed.           Follow-up Information   Follow up with Loanne Drilling, MD. Schedule an appointment as soon as possible for a visit on 08/25/2012. (Call for appointment time.)    Contact information:   [address] [place] [address] [phone]       Signed: Patrica Duel 08/15/2012, 7:11 AM

## 2012-08-15 NOTE — Op Note (Signed)
[name], Jared Romero                  ACCOUNT NO.:  [id number]  MEDICAL RECORD NO.:  [id number]  LOCATION:  1524                         FACILITY:  [facility]  PHYSICIAN:  Ollen Gross, M.D.    DATE OF BIRTH:  1970-11-10  DATE OF PROCEDURE:  08/14/2012 DATE OF DISCHARGE:                              OPERATIVE REPORT   PREOPERATIVE DIAGNOSIS:  Left bimalleolar ankle fracture dislocation.  POSTOPERATIVE DIAGNOSIS:  Left bimalleolar ankle fracture dislocation.  PROCEDURE:  Open reduction and internal fixation of left bimalleolar ankle fracture dislocation.  SURGEON:  Ollen Gross, M.D.  ASSISTANT:  Alexzandrew L. Perkins, P.A.C.  ANESTHESIA:  General.  ESTIMATED BLOOD LOSS:  Minimal.  DRAINS:  None.  COMPLICATIONS:  None.  CONDITION:  Stable to recovery.  BRIEF CLINICAL NOTE:  Jared Romero is a 42 year old male who was out in his yard earlier today when he slipped and fell, sustaining immediate pain and deformity to his left ankle.  Presented to the emergency room with ankle fracture dislocation.  The ER physician, Dr. Ranae Palms attempted closed reduction.  There was still some residual subluxation of the ankle.  I subsequently evaluated the patient and felt that he would benefit from open reduction and internal fixation to regain anatomic configuration of the ankle joint.  Procedure risks and complications of rehab course discussed in detail.  The patient likes to proceed with surgery.  PROCEDURE IN DETAIL:  After successful administration of general anesthetic, a tourniquet was placed high on his left thigh and his left lower extremity was prepped and draped in the usual sterile fashion. Extremities wrapped in Esmarch, tourniquet inflated to 300 mmHg.  The incision was then made over the lateral malleolus starting tip of the malleolus coursing about 5 cm up the leg.  The skin was cut with a 10 blade through subcutaneous tissue to the periosteum.  Fracture was identified and it  was reduced.  A 7-hole 1/3rd tubular plate was then configured to the anatomy of the lateral malleolus.  4 cortical screws and 2 cancellous screws were placed, cancellous screws distal to the fracture, cortical screws proximal to the fracture.  This was a stable anatomic reduction with excellent purchase of the screws.  C-arm fluoroscopy confirmed anatomic reduction of the fibula and of the joint.  Then, made a small vertical incision overlying the tip of the medial malleolus.  The fracture was identified and the medial malleolus was anatomically reduced.  Two guide pins for the 4.0 cannulated screws were then passed through the tip of the malleolus up into the tibia and these are parallel to each other.  A 50-mm 4.0 cancellous partially threaded screw was then passed over the guide pins and tightened down to the tip of the malleolus.  The C-arm again confirmed anatomic reduction of the medial malleolar fracture.  We did C-arm views AP mortise and lateral, showing anatomic reduction of the joint.  The wound was then copiously irrigated with saline solution.  Subcu was closed with interrupted 2-0 Vicryl and skin with staples.  Tourniquet release total time of 28 minutes.  The incision was cleaned and dried and a bulky sterile dressing applied.  He was placed into a  Cam walker, awakened and transported to recovery in stable condition.     Ollen Gross, M.D.     FA/MEDQ  D:  08/14/2012  T:  08/15/2012  Job:  [id number]

## 2012-08-16 ENCOUNTER — Encounter (HOSPITAL_COMMUNITY): Payer: Self-pay | Admitting: Orthopedic Surgery

## 2014-11-13 ENCOUNTER — Emergency Department (HOSPITAL_COMMUNITY): Payer: 59

## 2014-11-13 ENCOUNTER — Encounter (HOSPITAL_COMMUNITY): Payer: Self-pay | Admitting: *Deleted

## 2014-11-13 ENCOUNTER — Emergency Department (HOSPITAL_COMMUNITY)
Admission: EM | Admit: 2014-11-13 | Discharge: 2014-11-14 | Disposition: A | Discharge status: Home / Self Care | Payer: 59 | Attending: Physician Assistant | Admitting: Physician Assistant

## 2014-11-13 DIAGNOSIS — Y990 Civilian activity done for income or pay: Secondary | ICD-10-CM | POA: Diagnosis not present

## 2014-11-13 DIAGNOSIS — S62616A Displaced fracture of proximal phalanx of right little finger, initial encounter for closed fracture: Secondary | ICD-10-CM | POA: Diagnosis not present

## 2014-11-13 DIAGNOSIS — Y9389 Activity, other specified: Secondary | ICD-10-CM | POA: Diagnosis not present

## 2014-11-13 DIAGNOSIS — Z720 Tobacco use: Secondary | ICD-10-CM | POA: Insufficient documentation

## 2014-11-13 DIAGNOSIS — S52351A Displaced comminuted fracture of shaft of radius, right arm, initial encounter for closed fracture: Secondary | ICD-10-CM | POA: Insufficient documentation

## 2014-11-13 DIAGNOSIS — S6991XA Unspecified injury of right wrist, hand and finger(s), initial encounter: Secondary | ICD-10-CM | POA: Diagnosis present

## 2014-11-13 DIAGNOSIS — S62101A Fracture of unspecified carpal bone, right wrist, initial encounter for closed fracture: Secondary | ICD-10-CM

## 2014-11-13 DIAGNOSIS — W208XXA Other cause of strike by thrown, projected or falling object, initial encounter: Secondary | ICD-10-CM | POA: Insufficient documentation

## 2014-11-13 DIAGNOSIS — Y9289 Other specified places as the place of occurrence of the external cause: Secondary | ICD-10-CM | POA: Insufficient documentation

## 2014-11-13 DIAGNOSIS — S62609A Fracture of unspecified phalanx of unspecified finger, initial encounter for closed fracture: Secondary | ICD-10-CM

## 2014-11-13 DIAGNOSIS — R52 Pain, unspecified: Secondary | ICD-10-CM

## 2014-11-13 MED ORDER — OXYCODONE-ACETAMINOPHEN 5-325 MG PO TABS: 1.0000 | ORAL_TABLET | Freq: Four times a day (QID) | ORAL | Status: DC | PRN

## 2014-11-13 MED ORDER — OXYCODONE-ACETAMINOPHEN 5-325 MG PO TABS
1.0000 | ORAL_TABLET | Freq: Once | ORAL | Status: AC
  Administered 2014-11-13: 1 via ORAL
  Filled 2014-11-13: qty 1

## 2014-11-13 NOTE — ED Provider Notes (Signed)
CSN: [id number]     Arrival date & time 11/13/14  1927 History  By signing my name below, I, Tanda RockersMargaux Venter, attest that this documentation has been prepared under the direction and in the presence of Felicie Mornavid Jacob Cicero, NP. Electronically Signed: Tanda RockersMargaux Venter, ED Scribe. 11/13/2014. 9:33 PM.  Chief Complaint  Patient presents with  . Hand Injury   The history is provided by the patient. No language interpreter was used.     HPI Comments: Jared Romero is a 44 y.o. male who presents to the Emergency Department complaining of sudden onset, constant, 8/10, right hand pain that occurred earlier today. Pt states he was working on a car today when it fell and landed onto his hand, causing the pain. He notes increased swelling to the area as well. The pain is exacerbated with hand movement. Denies weakness, tingling, or any other associated symptoms. No hx DM, HTN, or kidney disease.   History reviewed. No pertinent past medical history. Past Surgical History  Procedure Laterality Date  . Orif ankle fracture Left 08/14/2012    Procedure: OPEN REDUCTION INTERNAL FIXATION (ORIF) ANKLE FRACTURE;  Surgeon: Loanne DrillingFrank V Aluisio, MD;  Location: [facility];  Service: Orthopedics;  Laterality: Left;   No family history on file. Social History  Substance Use Topics  . Smoking status: Current Every Day Smoker  . Smokeless tobacco: Never Used  . Alcohol Use: Yes    Review of Systems  Musculoskeletal: Positive for joint swelling and arthralgias (Right hand pain).  Skin: Negative for wound.  Neurological: Negative for weakness.  All other systems reviewed and are negative.     Allergies  Review of patient's allergies indicates no known allergies.  Home Medications   Prior to Admission medications   Medication Sig Start Date End Date Taking? Authorizing Provider  methocarbamol (ROBAXIN) 500 MG tablet Take 1 tablet (500 mg total) by mouth every 6 (six) hours as needed. 08/15/12   Avel Peacerew Perkins, PA-C  oxyCODONE  (OXY IR/ROXICODONE) 5 MG immediate release tablet Take 1-2 tablets (5-10 mg total) by mouth every 3 (three) hours as needed. 08/15/12   Avel Peacerew Perkins, PA-C   Triage Vitals: BP 137/82 mmHg  Pulse 78  Temp(Src) 98.7 F (37.1 C)  Ht 5\' 8"  (1.727 m)  Wt 165 lb (74.844 kg)  BMI 25.09 kg/m2  SpO2 98%   Physical Exam  Constitutional: He is oriented to person, place, and time. He appears well-developed and well-nourished. No distress.  HENT:  Head: Normocephalic and atraumatic.  Eyes: Conjunctivae and EOM are normal.  Neck: Neck supple. No tracheal deviation present.  Cardiovascular: Normal rate.   Pulmonary/Chest: Effort normal. No respiratory distress.  Musculoskeletal: Normal range of motion.  Right hand is swollen Sensation intact Able to move all fingers  Neurological: He is alert and oriented to person, place, and time.  Skin: Skin is warm and dry.  Psychiatric: He has a normal mood and affect. His behavior is normal.  Nursing note and vitals reviewed.   ED Course  Procedures (including critical care time)  DIAGNOSTIC STUDIES: Oxygen Saturation is 98% on RA, normal by my interpretation.    COORDINATION OF CARE: 9:28 PM-Discussed treatment plan which includes consult with hand specialist with pt at bedside and pt agreed to plan.   Labs Review Labs Reviewed - No data to display  Imaging Review Dg Wrist Complete Right  11/13/2014  CLINICAL DATA:  Pt states car he was working on slipped off jack and landed on his right hand, pain  right wrist EXAM: RIGHT WRIST - COMPLETE 3+ VIEW COMPARISON:  None. FINDINGS: There is a fracture of the distal radial metaphysis. Primary fracture line is transverse across the metaphysis. There is a secondary fracture component which intersects the articular margin between the lunate and scaphoid facets of the distal radius. Fracture is mildly impacted dorsally leading to mild dorsal angulation of the distal radial articular surface of approximately 12  degrees. No significant fracture displacement. There is diffuse surrounding soft tissue swelling. IMPRESSION: 1. Fracture of the distal radial metaphysis as described with intra-articular component and mild dorsal angulation of the distal radial articular surface. Electronically Signed   By: Amie Portland M.D.   On: 11/13/2014 21:03   Dg Hand Complete Right  11/13/2014  CLINICAL DATA:  Pt states car he was working on slipped off jack and landed on his right hand, pain right hand EXAM: RIGHT HAND - COMPLETE 3+ VIEW COMPARISON:  None. FINDINGS: There is a fracture across the base of the proximal phalanx of the fifth finger. A component of the fracture intersects the MCP joint articular margin. Fracture is nondisplaced. There is minor dorsal angulation. There is also small fracture along the ulnar corner at the base of the proximal phalanx of the fourth finger, without significant displacement. No other acute hand fracture. Deformity of the distal fifth metacarpals consistent with an old healed fracture. There is a fracture of the distal radial metaphysis which also has an intra-articular component. Please see the right wrist radiographs for further description. The joints are normally aligned. There is dorsal soft tissue swelling and diffuse wrist soft tissue swelling. IMPRESSION: 1. Mildly comminuted fracture of the base of the proximal phalanx of the fifth finger. 2. Small nondisplaced fracture of the ulnar corner at the base of the proximal phalanx of the fourth finger. 3. Comminuted fracture of the distal radial metaphysis. 4. No dislocation. Electronically Signed   By: Amie Portland M.D.   On: 11/13/2014 21:02   I have personally reviewed and evaluated these images as part of my medical decision-making.   EKG Interpretation None     Patient discussed with hand surgeon, who has reviewed the patient's radiology results.  He recommends a sugar tong splint with ulnar gutter extension.  He will see the  patient in follow-up later this week.  Patient to call office in the morning to schedule appointment. MDM   Final diagnoses:  Pain  Right wrist fracture. Fractures of proximal 4th and 5th digits right hand.  I personally performed the services described in this documentation, which was scribed in my presence. The recorded information has been reviewed and is accurate.     Felicie Morn, NP 11/14/14 0109  Courteney Randall An, MD 11/15/14 2300

## 2014-11-13 NOTE — ED Notes (Signed)
Patient states he was working on a car and it fell hitting him in the right hand.  Hand and wrist swollen

## 2014-11-13 NOTE — ED Notes (Signed)
Ortho notified that pt has returned from CT.

## 2014-11-13 NOTE — Progress Notes (Signed)
Orthopedic Tech Progress Note Patient Details:  Jeb LeveringRodney Dissinger 1970/09/23 [id number]  Ortho Devices Type of Ortho Device: Ace wrap, Arm sling, Sugartong splint, Ulna gutter splint Ortho Device/Splint Location: RUE Ortho Device/Splint Interventions: Ordered, Application   Jennye MoccasinHughes, Tish Begin Craig 11/13/2014, 10:51 PM

## 2014-11-13 NOTE — Discharge Instructions (Signed)
Wrist Fracture °A wrist fracture is a break or crack in one of the bones of your wrist. Your wrist is made up of eight small bones at the palm of your hand (carpal bones) and two long bones that make up your forearm (radius and ulna). °CAUSES °· A direct blow to the wrist. °· Falling on an outstretched hand. °· Trauma, such as a car accident or a fall. °RISK FACTORS °Risk factors for wrist fracture include: °· Participating in contact and high-risk sports, such as skiing, biking, and ice skating. °· Taking steroid medicines. °· Smoking. °· Being male. °· Being Caucasian. °· Drinking more than three alcoholic beverages per day. °· Having low or lowered bone density (osteoporosis or osteopenia). °· Age. Older adults have decreased bone density. °· Women who have had menopause. °· History of previous fractures. °SIGNS AND SYMPTOMS °Symptoms of wrist fractures include tenderness, bruising, and inflammation. Additionally, the wrist may hang in an odd position or appear deformed. °DIAGNOSIS °Diagnosis may include: °· Physical exam. °· X-ray. °TREATMENT °Treatment depends on many factors, including the nature and location of the fracture, your age, and your activity level. Treatment for wrist fracture can be nonsurgical or surgical. °Nonsurgical Treatment °A plaster cast or splint may be applied to your wrist if the bone is in a good position. If the fracture is not in good position, it may be necessary for your health care provider to realign it before applying a splint or cast. Usually, a cast or splint will be worn for several weeks. °Surgical Treatment °Sometimes the position of the bone is so far out of place that surgery is required to apply a device to hold it together as it heals. Depending on the fracture, there are a number of options for holding the bone in place while it heals, such as a cast and metal pins. °HOME CARE INSTRUCTIONS °· Keep your injured wrist elevated and move your fingers as much as  possible. °· Do not put pressure on any part of your cast or splint. It may break. °· Use a plastic bag to protect your cast or splint from water while bathing or showering. Do not lower your cast or splint into water. °· Take medicines only as directed by your health care provider. °· Keep your cast or splint clean and dry. If it becomes wet, damaged, or suddenly feels too tight, contact your health care provider right away. °· Do not use any tobacco products including cigarettes, chewing tobacco, or electronic cigarettes. Tobacco can delay bone healing. If you need help quitting, ask your health care provider. °· Keep all follow-up visits as directed by your health care provider. This is important. °· Ask your health care provider if you should take supplements of calcium and vitamins C and D to promote bone healing. °SEEK MEDICAL CARE IF: °· Your cast or splint is damaged, breaks, or gets wet. °· You have a fever. °· You have chills. °· You have continued severe pain or more swelling than you did before the cast was put on. °SEEK IMMEDIATE MEDICAL CARE IF: °· Your hand or fingernails on the injured arm turn blue or gray, or feel cold or numb. °· You have decreased feeling in the fingers of your injured arm. °MAKE SURE YOU: °· Understand these instructions. °· Will watch your condition. °· Will get help right away if you are not doing well or get worse. °  °This information is not intended to replace advice given to you by your   health care provider. Make sure you discuss any questions you have with your health care provider. °  °Document Released: 10/15/2004 Document Revised: 09/26/2014 Document Reviewed: 01/23/2011 °Elsevier Interactive Patient Education ©2016 [employer] ° °

## 2014-11-13 NOTE — ED Notes (Signed)
Patient transported to CT 

## 2014-11-14 NOTE — ED Notes (Signed)
Pt sts he is unable to sign discharge instructions.

## 2016-10-29 IMAGING — CT CT WRIST*R* W/O CM
2 of 5 series · 5 of 34 positions shown, 6 images · non-contrast
Comparison: Right wrist radiograph performed earlier today at [DATE]
p.m.

CLINICAL DATA: Car fell on patient's right wrist, with right wrist
pain and swelling. Initial encounter.

EXAM:
CT OF THE RIGHT WRIST WITHOUT CONTRAST
TECHNIQUE: Multidetector CT imaging of the right wrist was performed according
to the standard protocol. Multiplanar CT image reconstructions were
also generated.

[Series 2010: coronal bone · coronal · 0.28mm/px · 1 of 27 slices shown]
[im 14/27  bone]
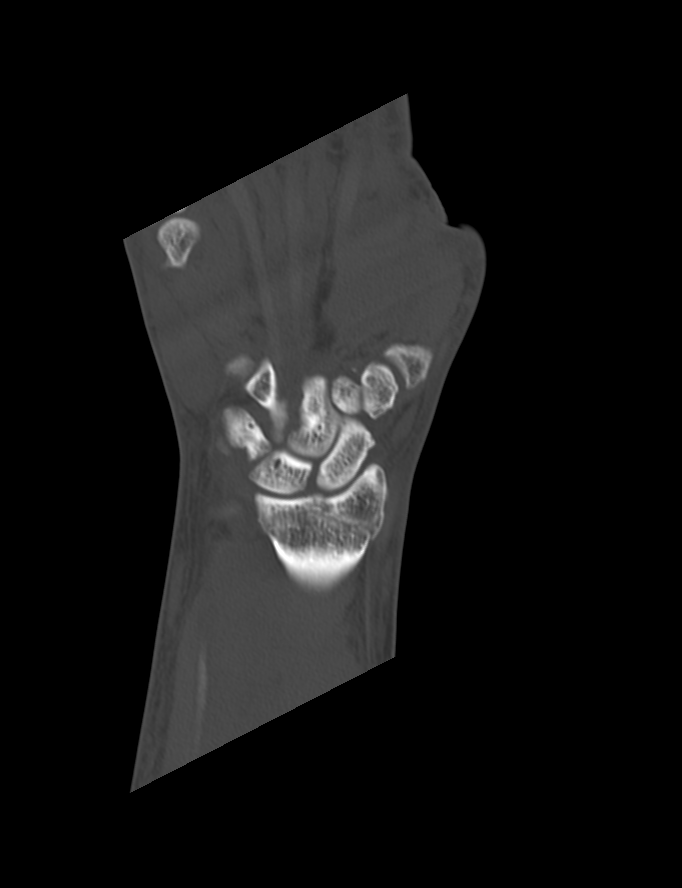

[Series 2012: axial bone · axial · 0.25mm/px · z∈[+120,+203]mm · 4 of 71 slices shown, 5 images]
[im 12/71  soft-tissue]
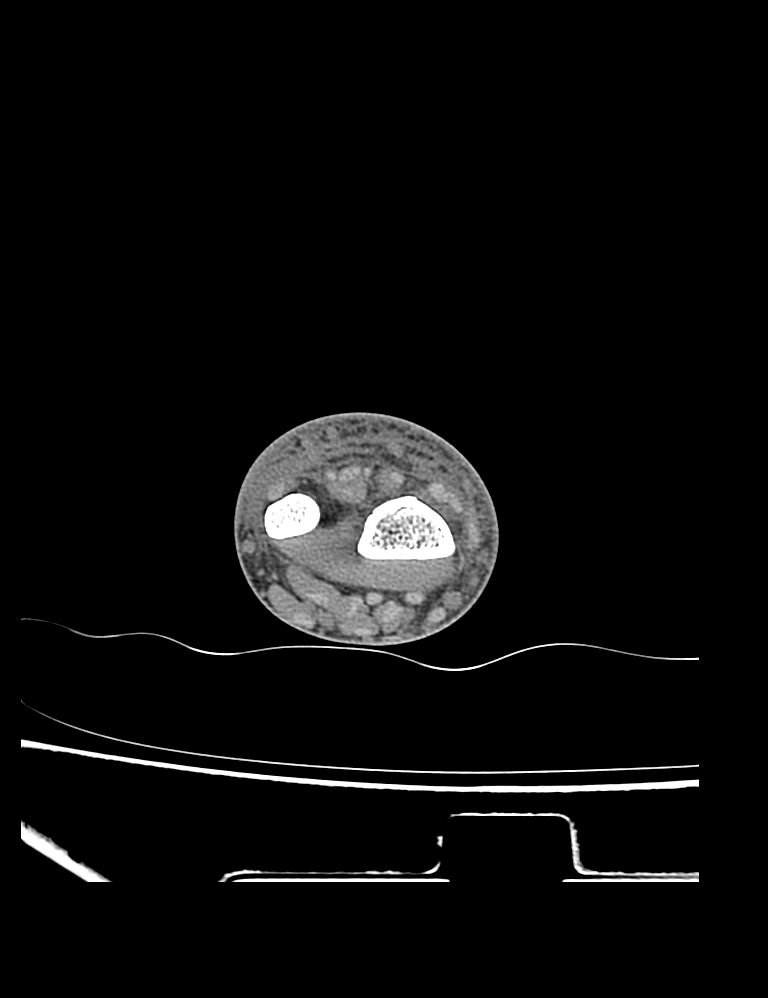
[im 12/71  bone]
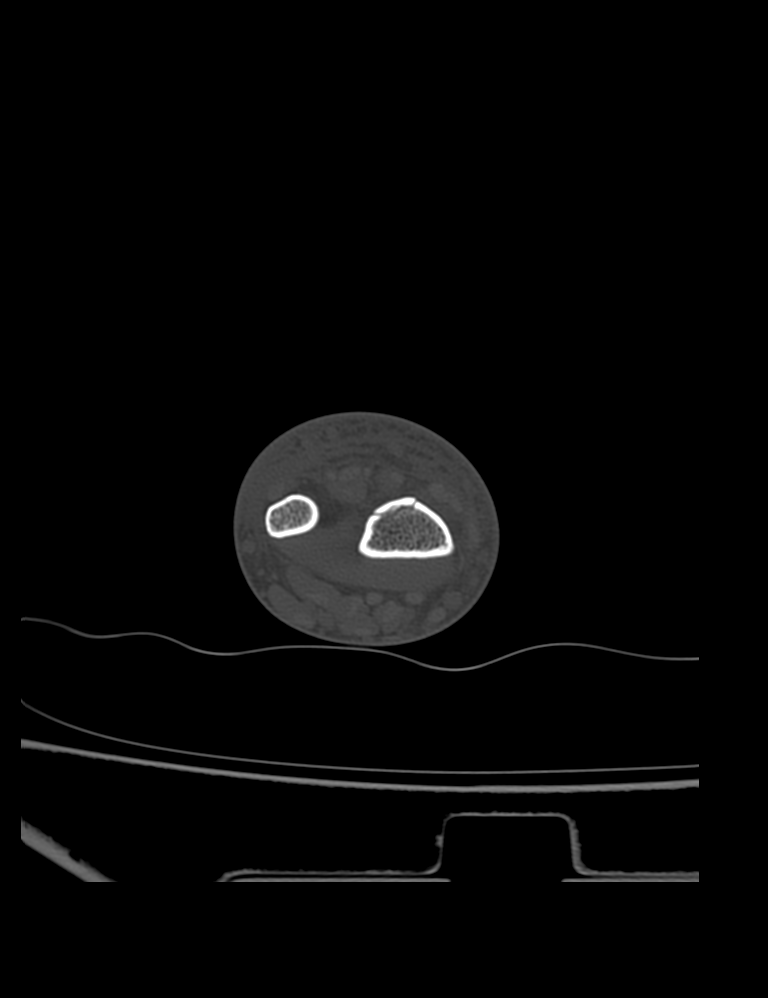
[im 24/71  bone]
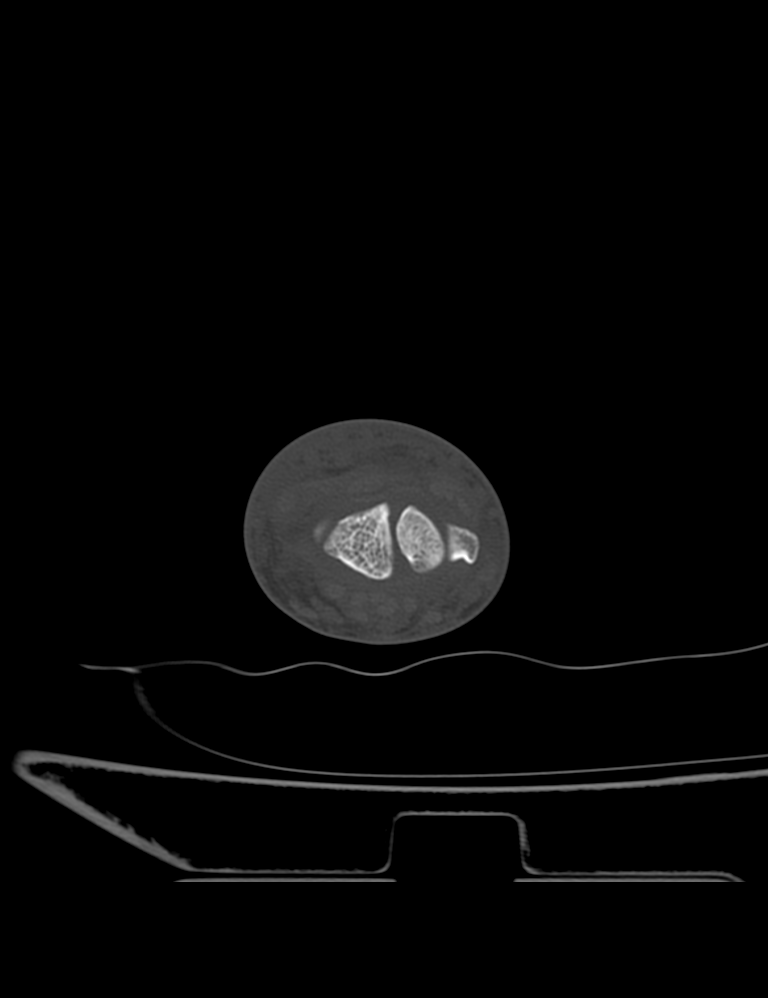
[im 47/71  bone]
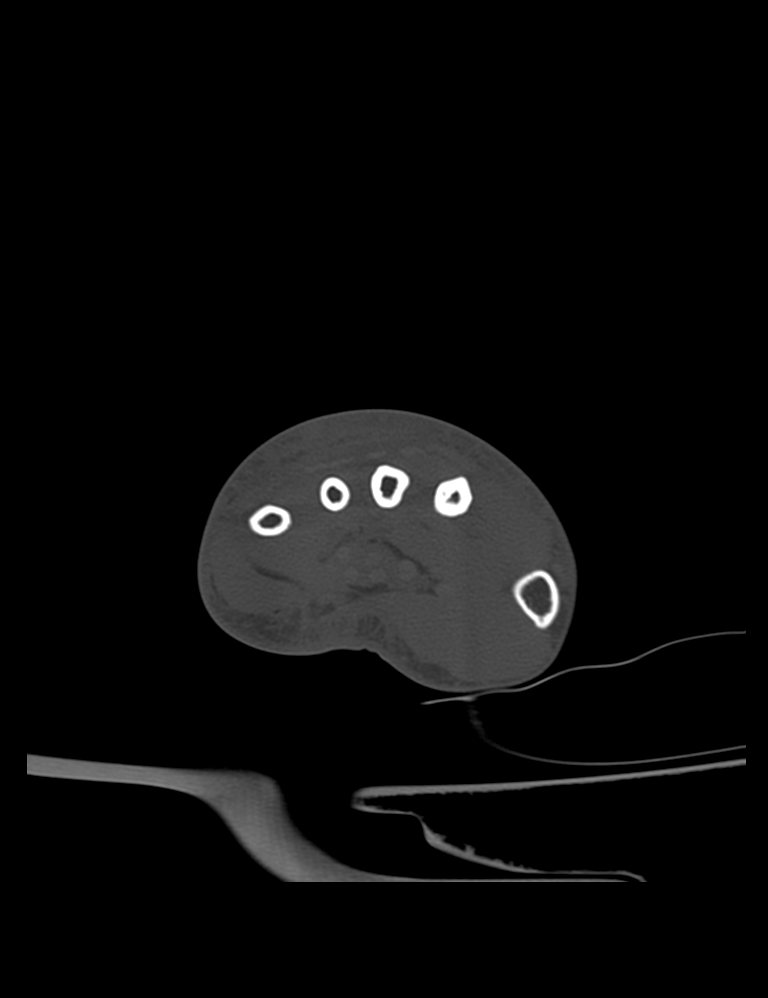
[im 59/71  bone]
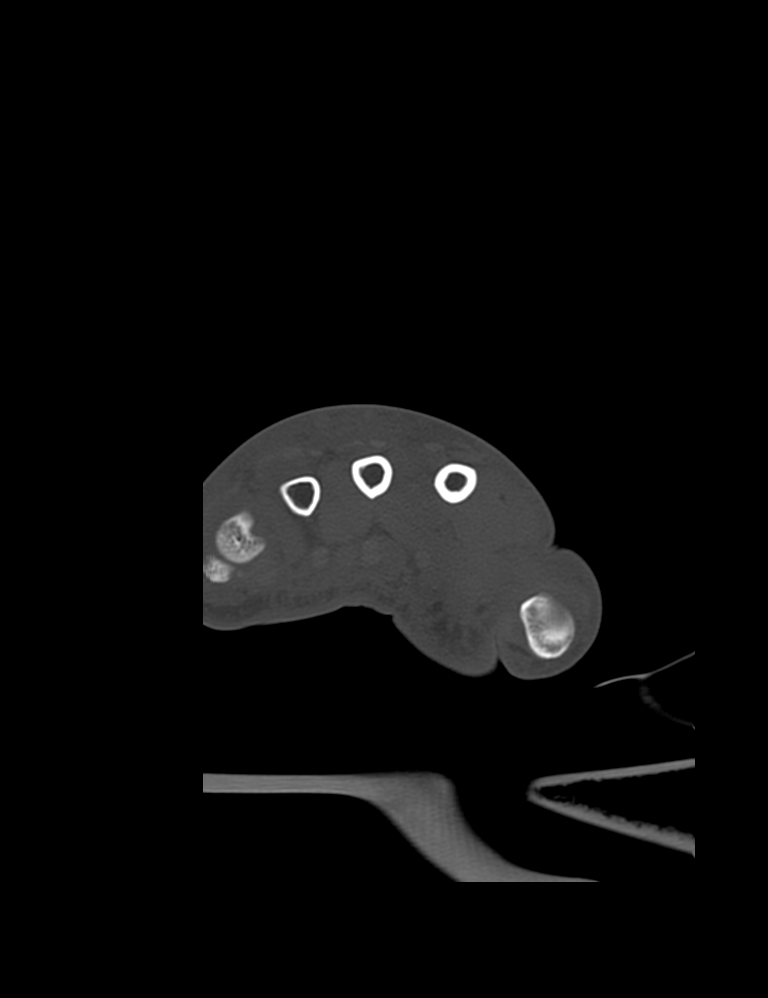

[5 of 34 positions shown; findings below may reference images not displayed]

FINDINGS: There is a comminuted and minimally displaced fracture involving the
distal radial metaphysis, with up to 3 mm of step-off seen at the
radiocarpal joint. No significant angulation is seen. The fracture
lines are more prominent dorsally. The ulnar styloid appears grossly
intact.

The carpal rows appear grossly intact, and demonstrate normal
alignment. The known fractures at the bases of the fourth and fifth
proximal phalanges are not imaged on this study. No additional
fractures are seen.

Diffuse dorsal soft tissue swelling is noted along the distal
forearm, wrist and hand. Visualized flexor and extensor tendons are
grossly unremarkable in appearance. The vasculature is not well
assessed without contrast. The carpal tunnel is grossly
unremarkable.
IMPRESSION: Comminuted and minimally displaced fracture involving the distal
radial metaphysis, with up to 3 mm of step-off seen at the
radiocarpal joint. No significant angulation seen. Diffuse dorsal
soft tissue swelling noted. Known fractures of the bases of the
fourth and fifth proximal phalanges are not imaged on this study.

## 2016-10-29 IMAGING — DX DG WRIST COMPLETE 3+V*R*
4 series · 4 of 4 positions shown · non-contrast
Comparison: None.

CLINICAL DATA: Pt states car he was working on slipped off [place] and
landed on his right hand, pain right wrist

EXAM:
RIGHT WRIST - COMPLETE 3+ VIEW

[wrist pa]
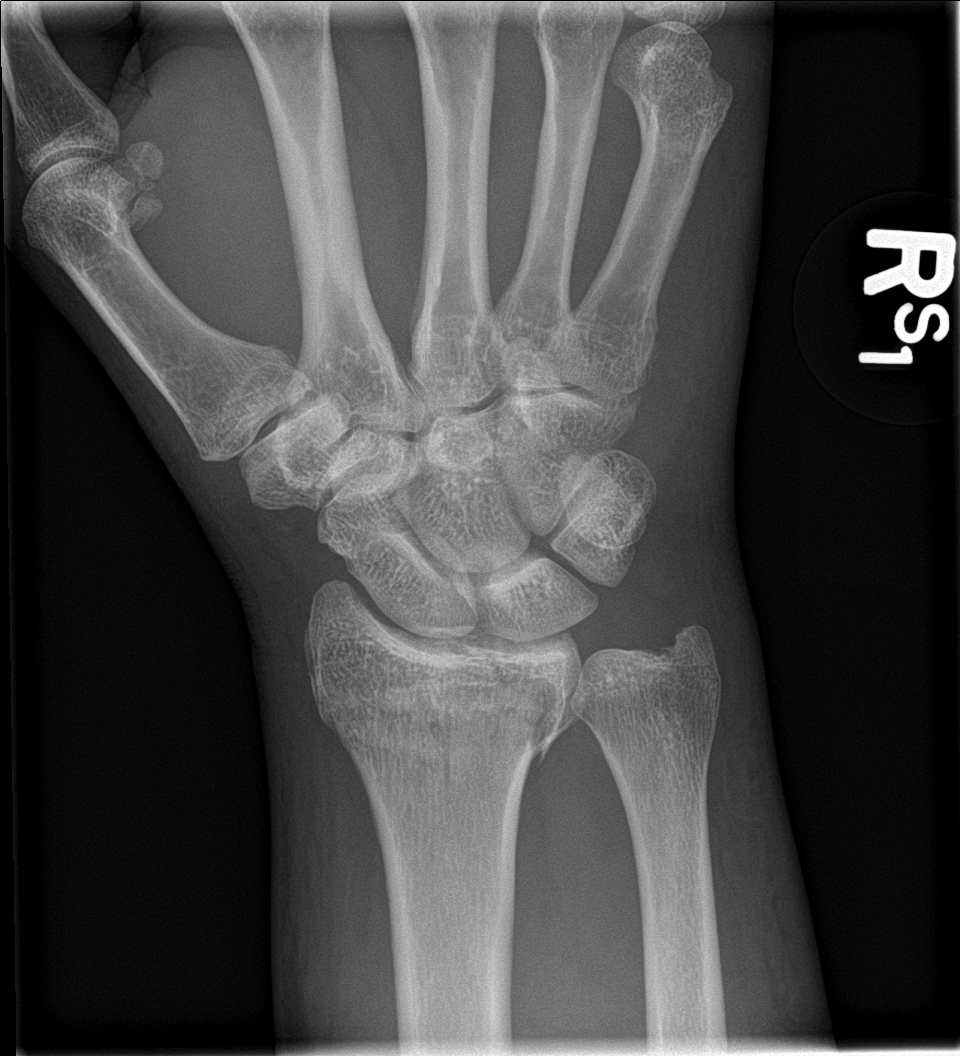

[wrist obl]
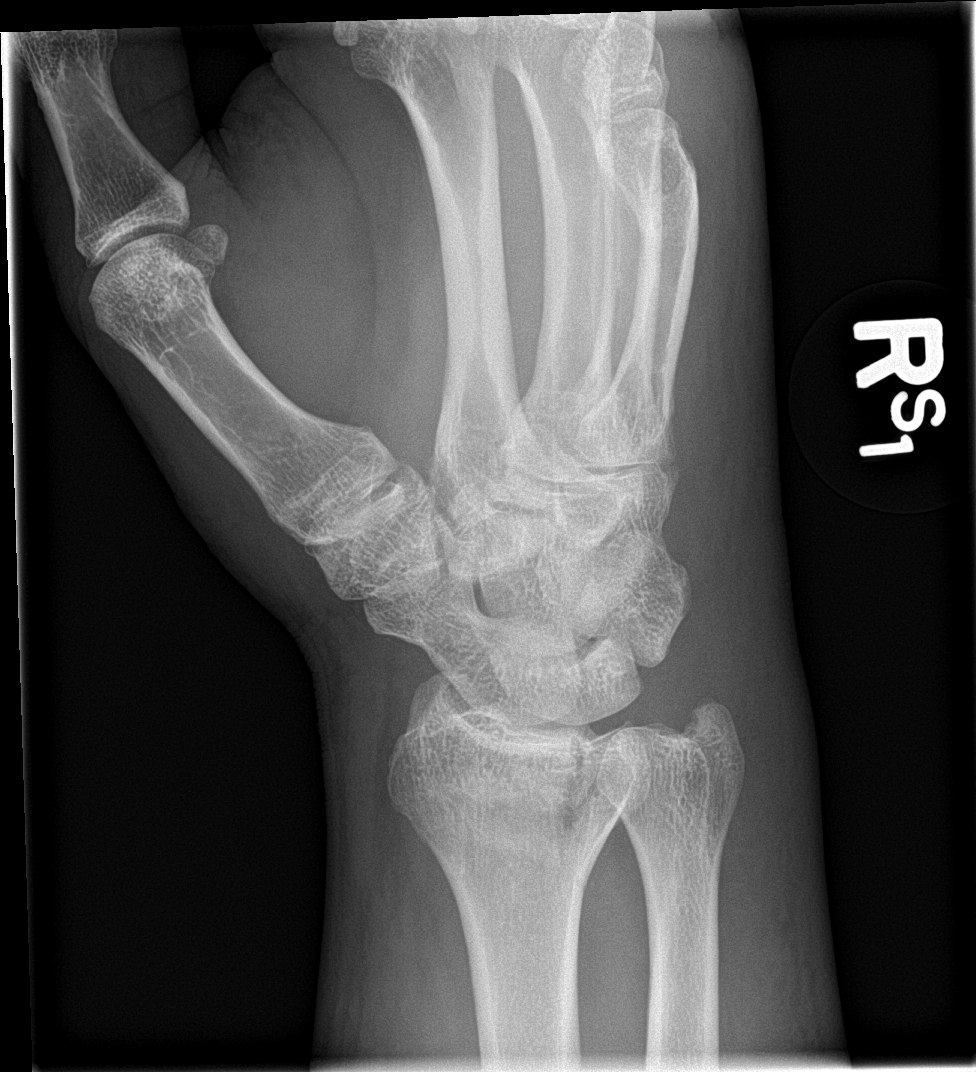

[wrist lat]
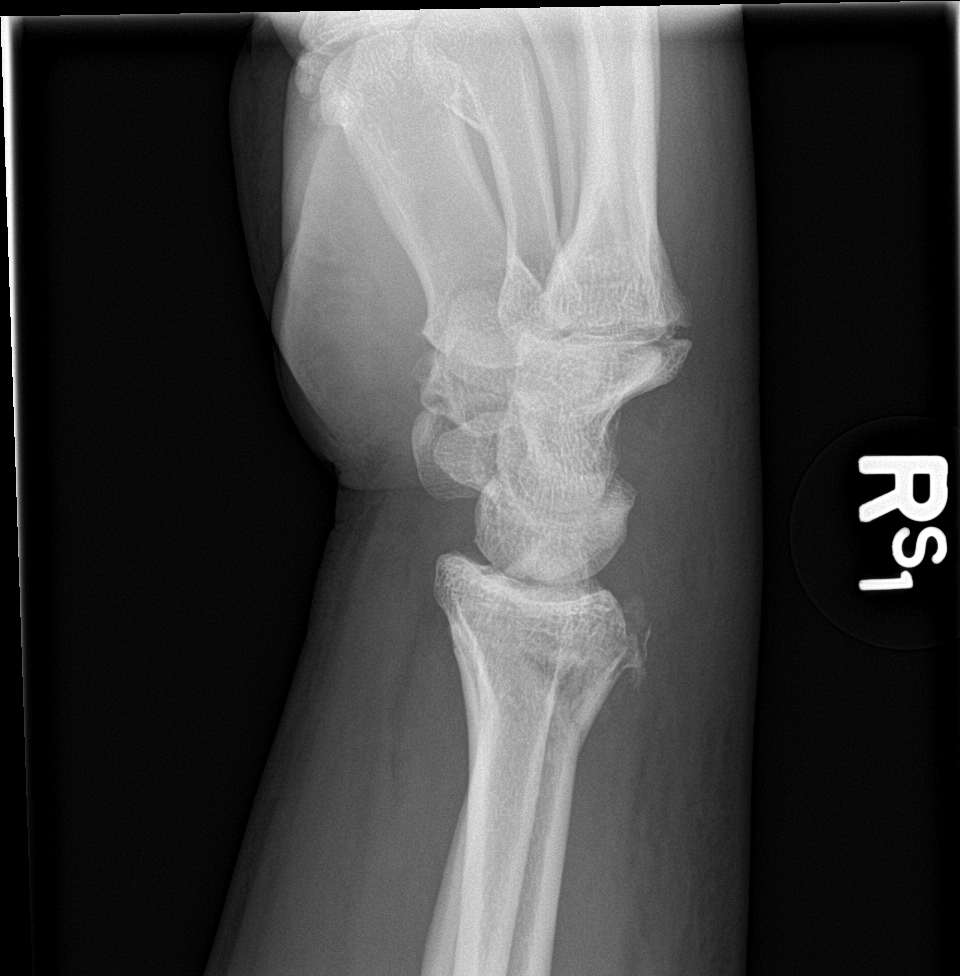

[wrist navicular]
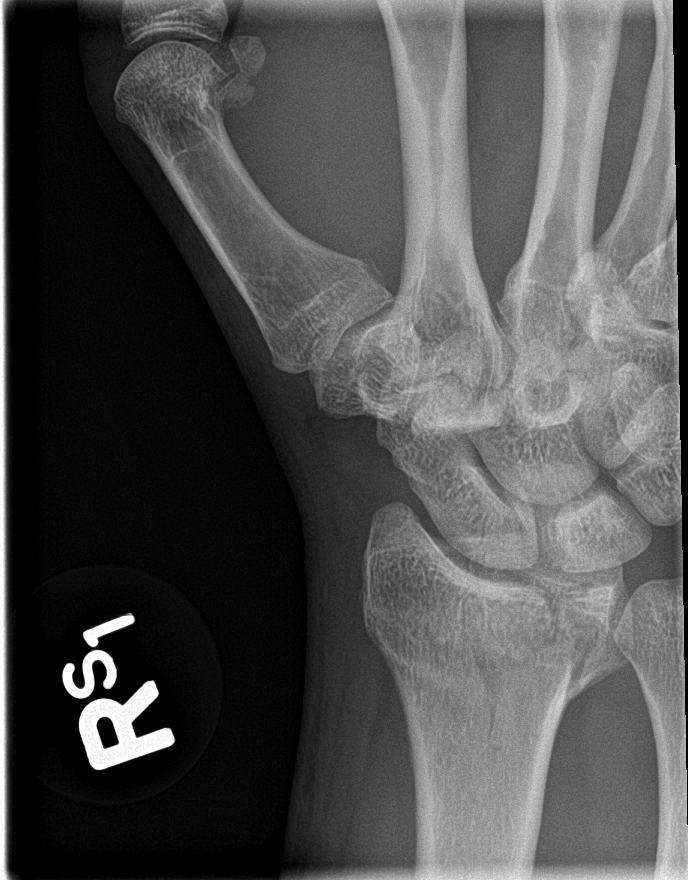

[4 of 4 positions shown; findings below may reference images not displayed]

FINDINGS: There is a fracture of the distal radial metaphysis. Primary
fracture line is transverse across the metaphysis. There is a
secondary fracture component which intersects the articular margin
between the lunate and scaphoid facets of the distal radius.
Fracture is mildly impacted dorsally leading to mild dorsal
angulation of the distal radial articular surface of approximately
12 degrees.

No significant fracture displacement.

There is diffuse surrounding soft tissue swelling.
IMPRESSION: 1. Fracture of the distal radial metaphysis as described with
intra-articular component and mild dorsal angulation of the distal
radial articular surface.

## 2021-03-01 ENCOUNTER — Encounter (HOSPITAL_BASED_OUTPATIENT_CLINIC_OR_DEPARTMENT_OTHER): Payer: Self-pay

## 2021-03-01 ENCOUNTER — Other Ambulatory Visit: Payer: Self-pay

## 2021-03-01 ENCOUNTER — Emergency Department (HOSPITAL_BASED_OUTPATIENT_CLINIC_OR_DEPARTMENT_OTHER)
Admission: EM | Admit: 2021-03-01 | Discharge: 2021-03-01 | Disposition: A | Discharge status: Home / Self Care | Payer: BC Managed Care – PPO | Attending: Emergency Medicine | Admitting: Emergency Medicine

## 2021-03-01 DIAGNOSIS — K529 Noninfective gastroenteritis and colitis, unspecified: Secondary | ICD-10-CM | POA: Diagnosis not present

## 2021-03-01 DIAGNOSIS — Z20822 Contact with and (suspected) exposure to covid-19: Secondary | ICD-10-CM | POA: Insufficient documentation

## 2021-03-01 DIAGNOSIS — R197 Diarrhea, unspecified: Secondary | ICD-10-CM

## 2021-03-01 LAB — COMPREHENSIVE METABOLIC PANEL WITH GFR
ALT: 33 U/L (ref 0–44)
AST: 43 U/L — ABNORMAL HIGH (ref 15–41)
Albumin: 4 g/dL (ref 3.5–5.0)
Alkaline Phosphatase: 62 U/L (ref 38–126)
Anion gap: 10 (ref 5–15)
BUN: 8 mg/dL (ref 6–20)
CO2: 27 mmol/L (ref 22–32)
Calcium: 9.1 mg/dL (ref 8.9–10.3)
Chloride: 100 mmol/L (ref 98–111)
Creatinine, Ser: 0.74 mg/dL (ref 0.61–1.24)
GFR, Estimated: >60 mL/min
Glucose, Bld: 113 mg/dL — ABNORMAL HIGH (ref 70–99)
Potassium: 4.1 mmol/L (ref 3.5–5.1)
Sodium: 137 mmol/L (ref 135–145)
Total Bilirubin: 1 mg/dL (ref 0.3–1.2)
Total Protein: 7.5 g/dL (ref 6.5–8.1)

## 2021-03-01 LAB — CBC WITH DIFFERENTIAL/PLATELET
Abs Immature Granulocytes: 0.02 K/uL (ref 0.00–0.07)
Basophils Absolute: 0 K/uL (ref 0.0–0.1)
Basophils Relative: 0 %
Eosinophils Absolute: 0 K/uL (ref 0.0–0.5)
Eosinophils Relative: 1 %
HCT: 41.1 % (ref 39.0–52.0)
Hemoglobin: 13.2 g/dL (ref 13.0–17.0)
Immature Granulocytes: 0 %
Lymphocytes Relative: 24 %
Lymphs Abs: 1.5 K/uL (ref 0.7–4.0)
MCH: 28.2 pg (ref 26.0–34.0)
MCHC: 32.1 g/dL (ref 30.0–36.0)
MCV: 87.8 fL (ref 80.0–100.0)
Monocytes Absolute: 0.4 K/uL (ref 0.1–1.0)
Monocytes Relative: 7 %
Neutro Abs: 4.2 K/uL (ref 1.7–7.7)
Neutrophils Relative %: 68 %
Platelets: 210 K/uL (ref 150–400)
RBC: 4.68 MIL/uL (ref 4.22–5.81)
RDW: 14.4 % (ref 11.5–15.5)
WBC: 6.2 K/uL (ref 4.0–10.5)
nRBC: 0 % (ref 0.0–0.2)

## 2021-03-01 LAB — RESP PANEL BY RT-PCR (FLU A&B, COVID) ARPGX2
Influenza A by PCR: NEGATIVE
Influenza B by PCR: NEGATIVE
SARS Coronavirus 2 by RT PCR: NEGATIVE

## 2021-03-01 LAB — LIPASE, BLOOD: Lipase: 51 U/L (ref 11–51)

## 2021-03-01 MED ORDER — LACTATED RINGERS IV BOLUS: 1000.0000 mL | Freq: Once | INTRAVENOUS | Status: DC

## 2021-03-01 NOTE — ED Notes (Signed)
Dc instructions reviewed with patient. Patient voiced understanding. Dc with belongings.  °

## 2021-03-01 NOTE — ED Provider Notes (Signed)
[facility] EMERGENCY DEPT Provider Note   CSN: [id number] Arrival date & time: 03/01/21  [id number]     History  Chief Complaint  Patient presents with   Diarrhea    Jared Romero is a 51 y.o. male.  HPI     51 year old male with no significant medical history presents with concern for diarrhea for 3 days.  For the last 3 days has had severe diarrhea, probably 10-15 episodes a day, reporting that it happened after every time he ate or drink.  Has not had an appetite, although reports that today his appetite is coming back.  Has not tried anything yet for the diarrhea.  Has not had any recent antibiotics, travel, or known sick contacts.  Denies associated nausea, vomiting or abdominal pain.  Reports that he has had fatigue and a mild cough.  Denies chest pain, shortness of breath or fever.  He has been drinking Pedialyte, but has not wanted to eat food over the last couple of days.  History reviewed. No pertinent past medical history.  Past Surgical History:  Procedure Laterality Date   ORIF ANKLE FRACTURE Left 08/14/2012   Procedure: OPEN REDUCTION INTERNAL FIXATION (ORIF) ANKLE FRACTURE;  Surgeon: Gearlean Alf, MD;  Location: [facility];  Service: Orthopedics;  Laterality: Left;     Home Medications Prior to Admission medications   Medication Sig Start Date End Date Taking? Authorizing Provider  methocarbamol (ROBAXIN) 500 MG tablet Take 1 tablet (500 mg total) by mouth every 6 (six) hours as needed. 08/15/12   Perkins, Alexzandrew L, PA-C  oxyCODONE (OXY IR/ROXICODONE) 5 MG immediate release tablet Take 1-2 tablets (5-10 mg total) by mouth every 3 (three) hours as needed. 08/15/12   Perkins, Alexzandrew L, PA-C  oxyCODONE-acetaminophen (PERCOCET/ROXICET) 5-325 MG tablet Take 1 tablet by mouth every 6 (six) hours as needed for severe pain. 11/13/14   Etta Quill, NP      Allergies    Patient has no known allergies.    Review of Systems   Review of Systems  Physical  Exam Updated Vital Signs BP (!) 158/92 (BP Location: Right Arm)    Pulse 76    Temp 98.6 F (37 C) (Oral)    Resp 15    Ht 5\' 8"  (1.727 m)    Wt 79.4 kg    SpO2 100%    BMI 26.61 kg/m  Physical Exam Vitals and nursing note reviewed.  Constitutional:      General: He is not in acute distress.    Appearance: Normal appearance. He is not ill-appearing, toxic-appearing or diaphoretic.  HENT:     Head: Normocephalic.  Eyes:     Conjunctiva/sclera: Conjunctivae normal.  Cardiovascular:     Rate and Rhythm: Normal rate and regular rhythm.     Pulses: Normal pulses.  Pulmonary:     Effort: Pulmonary effort is normal. No respiratory distress.  Abdominal:     General: Abdomen is flat. There is no distension.     Palpations: Abdomen is soft.     Tenderness: There is no abdominal tenderness. There is no guarding.     Comments: Reducible ventral hernia, nontender  Musculoskeletal:        General: No deformity or signs of injury.     Cervical back: No rigidity.  Skin:    General: Skin is warm and dry.     Coloration: Skin is not jaundiced or pale.  Neurological:     General: No focal deficit present.  Mental Status: He is alert and oriented to person, place, and time.    ED Results / Procedures / Treatments   Labs (all labs ordered are listed, but only abnormal results are displayed) Labs Reviewed  COMPREHENSIVE METABOLIC PANEL - Abnormal; Notable for the following components:      Result Value   Glucose, Bld 113 (*)    AST 43 (*)    All other components within normal limits  RESP PANEL BY RT-PCR (FLU A&B, COVID) ARPGX2  LIPASE, BLOOD  CBC WITH DIFFERENTIAL/PLATELET  URINALYSIS, ROUTINE W REFLEX MICROSCOPIC    EKG None  Radiology No results found.  Procedures Procedures    Medications Ordered in ED Medications - No data to display  ED Course/ Medical Decision Making/ A&P                           Medical Decision Making Amount and/or Complexity of Data  Reviewed Labs: ordered.    51 year old male with no significant medical history presents with concern for diarrhea for 3 days.  Benign abdominal exam and have low suspicion for diverticulitis, appendicitis, small bowel obstruction.  Denies black or bloody stools may have a specimen for GI bleed.  Does not have risk factors to suggest C. difficile colitis.  Do not suspect ischemic colitis. Suspect likely viral etiology given associated cough.  Labs were personally reviewed and interpreted by me and show a normal hemoglobin, no leukocytosis, negative COVID and flu swab, normal electrolytes and renal function, normal lipase.  Discussed possibility of receiving IV fluids in the emergency department given the amount of diarrhea he has been having, but also feel that since he is tolerating p.o. with increasing appetite, normal vital signs, and normal electrolytes, that continued p.o. hydration at home is also appropriate.  He prefers discharge with outpatient oral hydration.  Discussed possibility of taking Imodium for diarrhea relief, continue supportive care with drinking Pedialyte, and follow-up with PCP if continuing symptoms.        Final Clinical Impression(s) / ED Diagnoses Final diagnoses:  Gastroenteritis  Diarrhea of presumed infectious origin    Rx / DC Orders ED Discharge Orders     None         Gareth Morgan, MD 03/01/21 1028

## 2021-03-01 NOTE — ED Triage Notes (Signed)
Pt arrives POV with c/o of diarrhea for 3 days.  Feels weak and run down, hasn't had an appetite in several days.  Denies any chest pain, shortness of breath or fever.  Has been drinking Pedialyte.

## 2021-06-02 ENCOUNTER — Ambulatory Visit: Payer: Self-pay | Admitting: Surgery

## 2021-06-02 NOTE — Progress Notes (Signed)
Surgery orders requested via Epic °

## 2021-06-03 NOTE — Progress Notes (Signed)
DUE TO COVID-19 ONLY O 2 VISITOR IS ALLOWED TO COME WITH YOU AND STAY IN THE WAITING ROOM ONLY DURING PRE OP AND PROCEDURE DAY OF SURGERY.   4 VISITOR  MAY VISIT WITH YOU AFTER SURGERY IN YOUR PRIVATE ROOM DURING VISITING HOURS ONLY! ?YOU MAY HAVE ONE PERSON SPEND THE NITE WITH YOU IN YOUR ROOM AFTER SURGERY.   ? ? ? Your procedure is scheduled on:  ?                 06/09/21  ? ? Report to [facility] Main  Entrance ? ? Report to admitting at   1215pm              ?DO NOT BRING INSURANCE CARD, PICTURE ID OR WALLET DAY OF SURGERY.  ?  ? ? Call this number if you have problems the morning of surgery [phone]  ? ? REMEMBER: NO  SOLID FOODS , CANDY, GUM OR MINTS AFTER MIDNITE THE NITE BEFORE SURGERY .       Marland Kitchen CLEAR LIQUIDS UNTIL  1130am             DAY OF SURGERY.      PLEASE FINISH ENSURE DRINK PER SURGEON ORDER  WHICH NEEDS TO BE COMPLETED AT    1130    am   MORNING OF SURGERY.   ? ? ? ? ?CLEAR LIQUID DIET ? ? ?Foods Allowed      ?WATER ?BLACK COFFEE ( SUGAR OK, NO MILK, CREAM OR CREAMER) REGULAR AND DECAF  ?TEA ( SUGAR OK NO MILK, CREAM, OR CREAMER) REGULAR AND DECAF  ?PLAIN JELLO ( NO RED)  ?FRUIT ICES ( NO RED, NO FRUIT PULP)  ?POPSICLES ( NO RED)  ?JUICE- APPLE, WHITE GRAPE AND WHITE CRANBERRY  ?SPORT DRINK LIKE GATORADE ( NO RED)  ?CLEAR BROTH ( VEGETABLE , CHICKEN OR BEEF)                                                               ? ?    ? ?BRUSH YOUR TEETH MORNING OF SURGERY AND RINSE YOUR MOUTH OUT, NO CHEWING GUM CANDY OR MINTS. ?  ? ? Take these medicines the morning of surgery with A SIP OF WATER:  none  ? ? ?DO NOT TAKE ANY DIABETIC MEDICATIONS DAY OF YOUR SURGERY ?                  ?            You may not have any metal on your body including hair pins and  ?            piercings  Do not wear jewelry, make-up, lotions, powders or perfumes, deodorant ?            Do not wear nail polish on your fingernails.   ?           IF YOU ARE A MALE AND WANT TO SHAVE UNDER ARMS OR LEGS PRIOR TO  SURGERY YOU MUST DO SO AT LEAST 48 HOURS PRIOR TO SURGERY.  ?            Men may shave face and neck. ? ? Do not bring valuables to the hospital. [facility] IS NOT ?  RESPONSIBLE   FOR VALUABLES. ? Contacts, dentures or bridgework may not be worn into surgery. ? Leave suitcase in the car. After surgery it may be brought to your room. ? ?  ? Patients discharged the day of surgery will not be allowed to drive home. IF YOU ARE HAVING SURGERY AND GOING HOME THE SAME DAY, YOU MUST HAVE AN ADULT TO DRIVE YOU HOME AND BE WITH YOU FOR 24 HOURS. YOU MAY GO HOME BY TAXI OR UBER OR ORTHERWISE, BUT AN ADULT MUST ACCOMPANY YOU HOME AND STAY WITH YOU FOR 24 HOURS. ?  ? ?            Please read over the following fact sheets you were given: ?_____________________________________________________________________ ? ? - Preparing for Surgery ?Before surgery, you can play an important role.  Because skin is not sterile, your skin needs to be as free of germs as possible.  You can reduce the number of germs on your skin by washing with CHG (chlorahexidine gluconate) soap before surgery.  CHG is an antiseptic cleaner which kills germs and bonds with the skin to continue killing germs even after washing. ?Please DO NOT use if you have an allergy to CHG or antibacterial soaps.  If your skin becomes reddened/irritated stop using the CHG and inform your nurse when you arrive at Short Stay. ?Do not shave (including legs and underarms) for at least 48 hours prior to the first CHG shower.  You may shave your face/neck. ?Please follow these instructions carefully: ? 1.  Shower with CHG Soap the night before surgery and the  morning of Surgery. ? 2.  If you choose to wash your hair, wash your hair first as usual with your  normal  shampoo. ? 3.  After you shampoo, rinse your hair and body thoroughly to remove the  shampoo.                           4.  Use CHG as you would any other liquid soap.  You can apply chg directly   to the skin and wash  ?                     Gently with a scrungie or clean washcloth. ? 5.  Apply the CHG Soap to your body ONLY FROM THE NECK DOWN.   Do not use on face/ open      ?                     Wound or open sores. Avoid contact with eyes, ears mouth and genitals (private parts).  ?                     Production manager,  Genitals (private parts) with your normal soap. ?            6.  Wash thoroughly, paying special attention to the area where your surgery  will be performed. ? 7.  Thoroughly rinse your body with warm water from the neck down. ? 8.  DO NOT shower/wash with your normal soap after using and rinsing off  the CHG Soap. ?               9.  Pat yourself dry with a clean towel. ?           10.  Wear clean pajamas. ?  11.  Place clean sheets on your bed the night of your first shower and do not  sleep with pets. ?Day of Surgery : ?Do not apply any lotions/deodorants the morning of surgery.  Please wear clean clothes to the hospital/surgery center. ? ?FAILURE TO FOLLOW THESE INSTRUCTIONS MAY RESULT IN THE CANCELLATION OF YOUR SURGERY ?PATIENT SIGNATURE_________________________________ ? ?NURSE SIGNATURE__________________________________ ? ?________________________________________________________________________  ? ? ?           ?

## 2021-06-03 NOTE — Progress Notes (Addendum)
Anesthesia Review:  PCP: [facility]  Cardiologist : none  Chest x-ray : EKG : Echo : Stress test: Cardiac Cath :  Activity level: can do a flight of stairs without difficulty  Sleep Study/ CPAP : none  Fasting Blood Sugar :      / Checks Blood Sugar -- times a day:   Blood Thinner/ Instructions /Last Dose: ASA / Instructions/ Last Dose :

## 2021-06-06 ENCOUNTER — Encounter (HOSPITAL_COMMUNITY): Payer: Self-pay

## 2021-06-06 ENCOUNTER — Encounter (HOSPITAL_COMMUNITY)
Admission: RE | Admit: 2021-06-06 | Discharge: 2021-06-06 | Disposition: A | Discharge status: Home / Self Care | Payer: Self-pay | Source: Ambulatory Visit | Attending: Surgery | Admitting: Surgery

## 2021-06-06 ENCOUNTER — Other Ambulatory Visit: Payer: Self-pay

## 2021-06-06 VITALS — BP 135/82 | HR 84 | Temp 99.0°F | Resp 16 | Ht 68.0 in | Wt 170.0 lb

## 2021-06-06 DIAGNOSIS — Z01818 Encounter for other preprocedural examination: Secondary | ICD-10-CM

## 2021-06-06 DIAGNOSIS — Z01812 Encounter for preprocedural laboratory examination: Secondary | ICD-10-CM | POA: Insufficient documentation

## 2021-06-06 LAB — CBC
HCT: 45.4 % (ref 39.0–52.0)
Hemoglobin: 14.3 g/dL (ref 13.0–17.0)
MCH: 28 pg (ref 26.0–34.0)
MCHC: 31.5 g/dL (ref 30.0–36.0)
MCV: 88.8 fL (ref 80.0–100.0)
Platelets: 238 10*3/uL (ref 150–400)
RBC: 5.11 MIL/uL (ref 4.22–5.81)
RDW: 14.3 % (ref 11.5–15.5)
WBC: 7.7 10*3/uL (ref 4.0–10.5)
nRBC: 0 % (ref 0.0–0.2)

## 2021-06-09 ENCOUNTER — Ambulatory Visit (HOSPITAL_COMMUNITY): Payer: Self-pay | Admitting: Anesthesiology

## 2021-06-09 ENCOUNTER — Other Ambulatory Visit: Payer: Self-pay

## 2021-06-09 ENCOUNTER — Encounter (HOSPITAL_COMMUNITY)
Admission: RE | Disposition: A | Discharge status: Home / Self Care | Payer: Self-pay | Source: Home / Self Care | Attending: Surgery

## 2021-06-09 ENCOUNTER — Encounter (HOSPITAL_COMMUNITY): Payer: Self-pay | Admitting: Surgery

## 2021-06-09 ENCOUNTER — Ambulatory Visit (HOSPITAL_COMMUNITY)
Admission: RE | Admit: 2021-06-09 | Discharge: 2021-06-09 | Disposition: A | Discharge status: Home / Self Care | Payer: Self-pay | Attending: Surgery | Admitting: Surgery

## 2021-06-09 ENCOUNTER — Ambulatory Visit (HOSPITAL_BASED_OUTPATIENT_CLINIC_OR_DEPARTMENT_OTHER): Payer: Self-pay | Admitting: Anesthesiology

## 2021-06-09 DIAGNOSIS — K439 Ventral hernia without obstruction or gangrene: Secondary | ICD-10-CM

## 2021-06-09 DIAGNOSIS — Z01818 Encounter for other preprocedural examination: Secondary | ICD-10-CM

## 2021-06-09 DIAGNOSIS — Z87891 Personal history of nicotine dependence: Secondary | ICD-10-CM | POA: Insufficient documentation

## 2021-06-09 HISTORY — PX: EPIGASTRIC HERNIA REPAIR: SHX404

## 2021-06-09 SURGERY — REPAIR, HERNIA, EPIGASTRIC, ADULT
Anesthesia: General

## 2021-06-09 MED ORDER — ROCURONIUM BROMIDE 10 MG/ML (PF) SYRINGE
PREFILLED_SYRINGE | INTRAVENOUS | Status: AC
  Filled 2021-06-09: qty 10

## 2021-06-09 MED ORDER — CEFAZOLIN SODIUM-DEXTROSE 2-4 GM/100ML-% IV SOLN
INTRAVENOUS | Status: AC
  Filled 2021-06-09: qty 100

## 2021-06-09 MED ORDER — LACTATED RINGERS IV SOLN: INTRAVENOUS | Status: DC

## 2021-06-09 MED ORDER — ACETAMINOPHEN 500 MG PO TABS: 1000.0000 mg | ORAL_TABLET | Freq: Once | ORAL | Status: DC

## 2021-06-09 MED ORDER — BUPIVACAINE LIPOSOME 1.3 % IJ SUSP: 20.0000 mL | Freq: Once | INTRAMUSCULAR | Status: DC

## 2021-06-09 MED ORDER — ACETAMINOPHEN 500 MG PO TABS
ORAL_TABLET | ORAL | Status: AC
  Administered 2021-06-09: 1000 mg via ORAL
  Filled 2021-06-09: qty 2

## 2021-06-09 MED ORDER — DEXAMETHASONE SODIUM PHOSPHATE 10 MG/ML IJ SOLN
INTRAMUSCULAR | Status: AC
  Filled 2021-06-09: qty 1

## 2021-06-09 MED ORDER — FENTANYL CITRATE (PF) 250 MCG/5ML IJ SOLN
INTRAMUSCULAR | Status: DC | PRN
  Administered 2021-06-09 (×2): 50 ug via INTRAVENOUS

## 2021-06-09 MED ORDER — BUPIVACAINE-EPINEPHRINE (PF) 0.25% -1:200000 IJ SOLN
INTRAMUSCULAR | Status: AC
  Filled 2021-06-09: qty 30

## 2021-06-09 MED ORDER — ONDANSETRON HCL 4 MG/2ML IJ SOLN
INTRAMUSCULAR | Status: AC
  Filled 2021-06-09: qty 2

## 2021-06-09 MED ORDER — PROPOFOL 10 MG/ML IV BOLUS
INTRAVENOUS | Status: DC | PRN
  Administered 2021-06-09: 200 mg via INTRAVENOUS

## 2021-06-09 MED ORDER — HYDROMORPHONE HCL 2 MG/ML IJ SOLN
INTRAMUSCULAR | Status: AC
  Filled 2021-06-09: qty 1

## 2021-06-09 MED ORDER — FENTANYL CITRATE (PF) 100 MCG/2ML IJ SOLN
INTRAMUSCULAR | Status: AC
  Filled 2021-06-09: qty 2

## 2021-06-09 MED ORDER — CELECOXIB 200 MG PO CAPS
ORAL_CAPSULE | ORAL | Status: AC
  Administered 2021-06-09: 400 mg via ORAL
  Filled 2021-06-09: qty 2

## 2021-06-09 MED ORDER — CEFAZOLIN SODIUM-DEXTROSE 2-4 GM/100ML-% IV SOLN
2.0000 g | INTRAVENOUS | Status: AC
  Administered 2021-06-09: 2 g via INTRAVENOUS

## 2021-06-09 MED ORDER — OXYCODONE-ACETAMINOPHEN 5-325 MG PO TABS: 1.0000 | ORAL_TABLET | ORAL | 0 refills | Status: AC | PRN

## 2021-06-09 MED ORDER — ONDANSETRON HCL 4 MG/2ML IJ SOLN
INTRAMUSCULAR | Status: DC | PRN
  Administered 2021-06-09: 4 mg via INTRAVENOUS

## 2021-06-09 MED ORDER — CELECOXIB 200 MG PO CAPS: 400.0000 mg | ORAL_CAPSULE | ORAL | Status: AC

## 2021-06-09 MED ORDER — GABAPENTIN 300 MG PO CAPS
300.0000 mg | ORAL_CAPSULE | ORAL | Status: AC
Start: 2021-06-09 — End: 2021-06-09

## 2021-06-09 MED ORDER — ORAL CARE MOUTH RINSE: 15.0000 mL | Freq: Once | OROMUCOSAL | Status: AC

## 2021-06-09 MED ORDER — MIDAZOLAM HCL 5 MG/5ML IJ SOLN
INTRAMUSCULAR | Status: DC | PRN
  Administered 2021-06-09: 2 mg via INTRAVENOUS

## 2021-06-09 MED ORDER — SUGAMMADEX SODIUM 200 MG/2ML IV SOLN
INTRAVENOUS | Status: DC | PRN
  Administered 2021-06-09: 200 mg via INTRAVENOUS

## 2021-06-09 MED ORDER — SODIUM CHLORIDE 0.9 % IR SOLN
Status: DC | PRN
  Administered 2021-06-09: 1000 mL

## 2021-06-09 MED ORDER — PROPOFOL 10 MG/ML IV BOLUS
INTRAVENOUS | Status: AC
  Filled 2021-06-09: qty 20

## 2021-06-09 MED ORDER — DEXAMETHASONE SODIUM PHOSPHATE 10 MG/ML IJ SOLN
INTRAMUSCULAR | Status: DC | PRN
  Administered 2021-06-09: 10 mg via INTRAVENOUS

## 2021-06-09 MED ORDER — ACETAMINOPHEN 500 MG PO TABS: 1000.0000 mg | ORAL_TABLET | ORAL | Status: AC

## 2021-06-09 MED ORDER — HYDROMORPHONE HCL 1 MG/ML IJ SOLN
INTRAMUSCULAR | Status: DC | PRN
  Administered 2021-06-09 (×4): .5 mg via INTRAVENOUS

## 2021-06-09 MED ORDER — CHLORHEXIDINE GLUCONATE CLOTH 2 % EX PADS: 6.0000 | MEDICATED_PAD | Freq: Once | CUTANEOUS | Status: DC

## 2021-06-09 MED ORDER — LIDOCAINE HCL (PF) 2 % IJ SOLN
INTRAMUSCULAR | Status: AC
  Filled 2021-06-09: qty 5

## 2021-06-09 MED ORDER — BUPIVACAINE LIPOSOME 1.3 % IJ SUSP
INTRAMUSCULAR | Status: DC | PRN
  Administered 2021-06-09: 20 mL

## 2021-06-09 MED ORDER — MIDAZOLAM HCL 2 MG/2ML IJ SOLN
INTRAMUSCULAR | Status: AC
  Filled 2021-06-09: qty 2

## 2021-06-09 MED ORDER — CHLORHEXIDINE GLUCONATE 0.12 % MT SOLN
15.0000 mL | Freq: Once | OROMUCOSAL | Status: AC
  Administered 2021-06-09: 15 mL via OROMUCOSAL

## 2021-06-09 MED ORDER — BUPIVACAINE-EPINEPHRINE (PF) 0.25% -1:200000 IJ SOLN
INTRAMUSCULAR | Status: DC | PRN
  Administered 2021-06-09: 30 mL

## 2021-06-09 MED ORDER — GABAPENTIN 300 MG PO CAPS
ORAL_CAPSULE | ORAL | Status: AC
  Administered 2021-06-09: 300 mg via ORAL
  Filled 2021-06-09: qty 1

## 2021-06-09 MED ORDER — ROCURONIUM BROMIDE 10 MG/ML (PF) SYRINGE
PREFILLED_SYRINGE | INTRAVENOUS | Status: DC | PRN
  Administered 2021-06-09: 50 mg via INTRAVENOUS
  Administered 2021-06-09 (×2): 10 mg via INTRAVENOUS

## 2021-06-09 MED ORDER — BUPIVACAINE LIPOSOME 1.3 % IJ SUSP
INTRAMUSCULAR | Status: AC
  Filled 2021-06-09: qty 20

## 2021-06-09 MED ORDER — LIDOCAINE 2% (20 MG/ML) 5 ML SYRINGE
INTRAMUSCULAR | Status: DC | PRN
Start: 2021-06-09 — End: 2021-06-09
  Administered 2021-06-09: 40 mg via INTRAVENOUS

## 2021-06-09 SURGICAL SUPPLY — 47 items
BAG COUNTER SPONGE SURGICOUNT (BAG) IMPLANT
BENZOIN TINCTURE PRP APPL 2/3 (GAUZE/BANDAGES/DRESSINGS) IMPLANT
COVER SURGICAL LIGHT HANDLE (MISCELLANEOUS) ×2 IMPLANT
DERMABOND ADVANCED (GAUZE/BANDAGES/DRESSINGS) ×1
DERMABOND ADVANCED .7 DNX12 (GAUZE/BANDAGES/DRESSINGS) IMPLANT
DRAIN CHANNEL 19F RND (DRAIN) IMPLANT
DRAPE LAPAROTOMY T 98X78 PEDS (DRAPES) IMPLANT
ELECT REM PT RETURN 15FT ADLT (MISCELLANEOUS) ×2 IMPLANT
EVACUATOR SILICONE 100CC (DRAIN) IMPLANT
GAUZE 4X4 16PLY ~~LOC~~+RFID DBL (SPONGE) ×1 IMPLANT
GAUZE SPONGE 4X4 12PLY STRL (GAUZE/BANDAGES/DRESSINGS) IMPLANT
GLOVE BIO SURGEON STRL SZ7 (GLOVE) ×2 IMPLANT
GLOVE BIO SURGEON STRL SZ7.5 (GLOVE) ×2 IMPLANT
GLOVE BIOGEL PI IND STRL 8 (GLOVE) ×1 IMPLANT
GLOVE BIOGEL PI INDICATOR 8 (GLOVE) ×1
GLOVE BIOGEL PI ORTHO PRO 7.5 (GLOVE) ×1
GLOVE PI ORTHO PRO STRL 7.5 (GLOVE) ×1 IMPLANT
GOWN STRL REUS W/ TWL XL LVL3 (GOWN DISPOSABLE) ×2 IMPLANT
GOWN STRL REUS W/TWL XL LVL3 (GOWN DISPOSABLE) ×2
KIT BASIN OR (CUSTOM PROCEDURE TRAY) ×2 IMPLANT
KIT TURNOVER KIT A (KITS) IMPLANT
MESH PROLENE PML 12X12 (Mesh General) ×1 IMPLANT
NEEDLE HYPO 22GX1.5 SAFETY (NEEDLE) ×1 IMPLANT
NS IRRIG 1000ML POUR BTL (IV SOLUTION) ×2 IMPLANT
PACK GENERAL/GYN (CUSTOM PROCEDURE TRAY) ×2 IMPLANT
SPIKE FLUID TRANSFER (MISCELLANEOUS) ×2 IMPLANT
STAPLER VISISTAT 35W (STAPLE) IMPLANT
STRIP CLOSURE SKIN 1/2X4 (GAUZE/BANDAGES/DRESSINGS) IMPLANT
SUT ETHILON 2 0 PS N (SUTURE) IMPLANT
SUT MNCRL AB 4-0 PS2 18 (SUTURE) ×1 IMPLANT
SUT NOVA 0 T19/GS 22DT (SUTURE) IMPLANT
SUT NOVA 1 T20/GS 25DT (SUTURE) IMPLANT
SUT NOVA NAB DX-16 0-1 5-0 T12 (SUTURE) IMPLANT
SUT PDS AB 1 TP1 96 (SUTURE) IMPLANT
SUT PDS AB 2-0 CT2 27 (SUTURE) ×4 IMPLANT
SUT PROLENE 0 CT 1 CR/8 (SUTURE) IMPLANT
SUT VIC AB 2-0 SH 18 (SUTURE) ×1 IMPLANT
SUT VIC AB 2-0 SH 27 (SUTURE)
SUT VIC AB 2-0 SH 27X BRD (SUTURE) IMPLANT
SUT VIC AB 3-0 SH 18 (SUTURE) ×2 IMPLANT
SUT VIC AB 3-0 SH 27 (SUTURE)
SUT VIC AB 3-0 SH 27X BRD (SUTURE) IMPLANT
SYR CONTROL 10ML LL (SYRINGE) IMPLANT
TOWEL OR 17X26 10 PK STRL BLUE (TOWEL DISPOSABLE) ×2 IMPLANT
TOWEL OR NON WOVEN STRL DISP B (DISPOSABLE) ×2 IMPLANT
TRAY FOLEY MTR SLVR 14FR STAT (SET/KITS/TRAYS/PACK) IMPLANT
TRAY FOLEY MTR SLVR 16FR STAT (SET/KITS/TRAYS/PACK) IMPLANT

## 2021-06-09 NOTE — Anesthesia Procedure Notes (Signed)
Procedure Name: Intubation Date/Time: 06/09/2021 12:42 PM Performed by: Manuel Dall D, CRNA Pre-anesthesia Checklist: Patient identified, Emergency Drugs available, Suction available and Patient being monitored Patient Re-evaluated:Patient Re-evaluated prior to induction Oxygen Delivery Method: Circle system utilized Preoxygenation: Pre-oxygenation with 100% oxygen Induction Type: IV induction Ventilation: Mask ventilation without difficulty Laryngoscope Size: Miller and 2 (by C Hauck SRNA) Grade View: Grade I Tube type: Oral Tube size: 7.5 mm Number of attempts: 1 Airway Equipment and Method: Stylet and Oral airway Placement Confirmation: ETT inserted through vocal cords under direct vision, positive ETCO2 and breath sounds checked- equal and bilateral Secured at: 22 cm Tube secured with: Tape Dental Injury: Teeth and Oropharynx as per pre-operative assessment

## 2021-06-09 NOTE — Op Note (Signed)
   Patient: Jared Romero (1970-09-04, [id number])  Date of Surgery: 06/09/2021   Preoperative Diagnosis: VENTRAL HERNIA  Postoperative Diagnosis: VENTRAL HERNIA  Surgical Procedure: OPEN VENTRAL HERNIA REPAIR WITH MESH PLACEMENT  Operative Team Members:  Surgeon(s) and Role:    * Nikodem Leadbetter, Hyman Hopes, MD - Primary   Anesthesiologist: Lucretia Kern, MD CRNA: Yolonda Kida, CRNA; Williford, Peggy D, CRNA   Anesthesia: General   Fluids:  No intake/output data recorded.  Complications: None  Drains:  None  Specimen: None   Disposition:  PACU - hemodynamically stable.  Plan of Care: Discharge to home after PACU  Indications for Procedure: Jared Romero is a 51 y.o. male who presented with a symptomatic epigastric hernia.  There was a palpable defect of ~1.5 cm x 1.5cm.  I recommended epigastric hernia repair open with mesh. The procedure itself as well as its risk, benefits, and alternatives were discussed with patient in full who granted consent to proceed. We will proceed as scheduled  Findings:  Hernia Location: Ventral hernia location: Epigastric (M2) and Umbilical (M3) Hernia Size:  15 cm tall x 7 cm wide  Mesh Size &Type:  21 cm tall x 15 cm tall Ethicon polypropylene mesh Mesh Position: Sublay - Preperitoneal Myofascial Releases: None  Description of Procedure:  The patient was positioned supine, padded and secured to the bed.  The abdomen was widely prepped and draped.  A time out procedure was performed.   A midline incision was made over the palpable epigastric bulge.  We encountered 4 hernia defects.  The initial defect was the reducible hernia palpated on exam.  Three nearby defects were identified as well containing incarcerated herniated preperitoneal fat.  The fat was partially ligated and resected and partially reduced.    A preperitoneal repair was performed.  A wide pre peritoneal dissection was performed creating a space for mesh placement.  During this  dissection an umbilical defect was identified nearby to the epigastric defects.  Fat was reduced from the hernia and a PDS suture was used to close this defect from beneath.  The total of all these hernia defects measured 15 cm tall by 7 cm wide.  A piece of Ethicon polypropylene mesh was opened and trimmed to 21 cm tall by 15 cm wide and deployed into the pre peritoneal space.  The mesh laid in taut position in the pre peritoneal plane and did not require fixation.  The space was irrigated with normal saline.  The fascial defect was closed vertically, overtop the mesh with running 2-0 PDS suture.  Local was injected into the rectus muscle.  The deep dermal tissues were closed with 2-0 vicryl.  The skin was closed with 4-0 Monocryl subcuticular suture and skin glue.  All sponge and needle counts were correct at the end of the case.   Ivar Drape, MD General, Bariatric, & Minimally Invasive Surgery [facility], [place]

## 2021-06-09 NOTE — Anesthesia Postprocedure Evaluation (Signed)
Anesthesia Post Note  Patient: Rien Marland  Procedure(s) Performed: OPEN EPIGASTRIC HERNIA REPAIR WITH MESH     Patient location during evaluation: PACU Anesthesia Type: General Level of consciousness: awake and alert Pain management: pain level controlled Vital Signs Assessment: post-procedure vital signs reviewed and stable Respiratory status: spontaneous breathing, nonlabored ventilation and respiratory function stable Cardiovascular status: blood pressure returned to baseline and stable Postop Assessment: no apparent nausea or vomiting Anesthetic complications: no   No notable events documented.  Last Vitals:  Vitals:   06/09/21 1500 06/09/21 1515  BP: (!) 149/90 (!) 144/88  Pulse: 86 88  Resp: 16 12  Temp:    SpO2: 92% 94%    Last Pain:  Vitals:   06/09/21 1515  TempSrc:   PainSc: 0-No pain                 Lucretia Kern

## 2021-06-09 NOTE — Transfer of Care (Signed)
Immediate Anesthesia Transfer of Care Note  Patient: Jared Romero  Procedure(s) Performed: OPEN EPIGASTRIC HERNIA REPAIR WITH MESH  Patient Location: PACU  Anesthesia Type:General  Level of Consciousness: awake, alert , oriented, drowsy and patient cooperative  Airway & Oxygen Therapy: Patient Spontanous Breathing and Patient connected to face mask oxygen  Post-op Assessment: Report given to RN and Post -op Vital signs reviewed and stable  Post vital signs: Reviewed and stable  Last Vitals:  Vitals Value Taken Time  BP 153/89 1424  Temp    Pulse 85 06/09/21 1422  Resp 13 06/09/21 1422  SpO2 100 % 06/09/21 1422  Vitals shown include unvalidated device data.  Last Pain:  Vitals:   06/09/21 1009  TempSrc:   PainSc: 0-No pain         Complications: No notable events documented.

## 2021-06-09 NOTE — Discharge Instructions (Signed)
 VENTRAL HERNIA REPAIR POST OPERATIVE INSTRUCTIONS  Thinking Clearly  The anesthesia may cause you to feel different for 1 or 2 days. Do not drive, drink alcohol, or make any big decisions for at least 2 days.  Nutrition When you wake up, you will be able to drink small amounts of liquid. If you do not feel sick, you can slowly advance your diet to regular foods. Continue to drink lots of fluids, usually about 8 to 10 glasses per day. Eat a high-fiber diet so you don't strain during bowel movements. High-Fiber Foods Foods high in fiber include beans, bran cereals and whole-grain breads, peas, dried fruit (figs, apricots, and dates), raspberries, blackberries, strawberries, sweet corn, broccoli, baked potatoes with skin, plums, pears, apples, greens, and nuts. Activity Slowly increase your activity. Be sure to get up and walk every hour or so to prevent blood clots. No heavy lifting or strenuous activity for 4 weeks following surgery to prevent hernias at your incision sites or recurrence of your hernia. It is normal to feel tired. You may need more sleep than usual.  Get your rest but make sure to get up and move around frequently to prevent blood clots and pneumonia.  Work and Return to School You can go back to work when you feel well enough. Discuss the timing with your surgeon. You can usually go back to school or work 1 week or less after an laparoscopic or an open repair. If your work requires heavy lifting or strenuous activity you need to be placed on light duty for 4 weeks following surgery. You can return to gym class, sports or other physical activities 4 weeks after surgery.  Wound Care You may experience significant bruising throughout the abdominal wall that may track down into the groin including into the scrotum in males.  Rest, elevating the groin and scrotum above the level of the heart, ice and compression with tight fitting underwear or an abdominal binder can help.   Always wash your hands before and after touching near your incision site. Do not soak in a bathtub until cleared at your follow up appointment. You may take a shower 24 hours after surgery. A small amount of drainage from the incision is normal. If the drainage is thick and yellow or the site is red, you may have an infection, so call your surgeon. If you have a drain in one of your incisions, it will be taken out in office when the drainage stops. Steri-Strips will fall off in 7 to 10 days or they will be removed during your first office visit. If you have dermabond glue covering over the incision, allow the glue to flake off on its own. Protect the new skin, especially from the sun. The sun can burn and cause darker scarring. Your scar will heal in about 4 to 6 weeks and will become softer and continue to fade over the next year.  The cosmetic appearance of the incisions will improve over the course of the first year after surgery. Sensation around your incision will return in a few weeks or months.  Bowel Movements After intestinal surgery, you may have loose watery stools for several days. If watery diarrhea lasts longer than 3 days, contact your surgeon. Pain medication (narcotics) can cause constipation. Increase the fiber in your diet with high-fiber foods if you are constipated. You can take an over the counter stool softener like Colace to avoid constipation.  Additional over the counter medications can also be used   if Colace isn't sufficient (for example, Milk of Magnesia or Miralax).  Pain The amount of pain is different for each person. Some people need only 1 to 3 doses of pain control medication, while others need more. Take alternating doses of tylenol and ibuprofen around the clock for the first five days following surgery.  This will provide a baseline of pain control and help with inflammation.  Take the narcotic pain medication in addition if needed for severe pain.  Contact  Your Surgeon at [phone], if you have: Pain that will not go away Pain that gets worse A fever of more than 101F (38.3C) Repeated vomiting Swelling, redness, bleeding, or bad-smelling drainage from your wound site Strong abdominal pain No bowel movement or unable to pass gas for 3 days Watery diarrhea lasting longer than 3 days  Pain Control The goal of pain control is to minimize pain, keep you moving and help you heal. Your surgical team will work with you on your pain plan. Most often a combination of therapies and medications are used to control your pain. You may also be given medication (local anesthetic) at the surgical site. This may help control your pain for several days. Extreme pain puts extra stress on your body at a time when your body needs to focus on healing. Do not wait until your pain has reached a level "10" or is unbearable before telling your doctor or nurse. It is much easier to control pain before it becomes severe. Following a laparoscopic procedure, pain is sometimes felt in the shoulder. This is due to the gas inserted into your abdomen during the procedure. Moving and walking helps to decrease the gas and the right shoulder pain.  Use the guide below for ways to manage your post-operative pain. Learn more by going to facs.org/safepaincontrol.  How Intense Is My Pain Common Therapies to Feel Better       I hardly notice my pain, and it does not interfere with my activities.  I notice my pain and it distracts me, but I can still do activities (sitting up, walking, standing).  Non-Medication Therapies  Ice (in a bag, applied over clothing at the surgical site), elevation, rest, meditation, massage, distraction (music, TV, play) walking and mild exercise Splinting the abdomen with pillows +  Non-Opioid Medications Acetaminophen (Tylenol) Non-steroidal anti-inflammatory drugs (NSAIDS) Aspirin, Ibuprofen (Motrin, Advil) Naproxen (Aleve) Take these as  needed, when you feel pain. Both acetaminophen and NSAIDs help to decrease pain and swelling (inflammation).      My pain is hard to ignore and is more noticeable even when I rest.  My pain interferes with my usual activities.  Non-Medication Therapies  +  Non-Opioid medications  Take on a regular schedule (around-the-clock) instead of as needed. (For example, Tylenol every 6 hours at 9:00 am, 3:00 pm, 9:00 pm, 3:00 am and Motrin every 6 hours at 12:00 am, 6:00 am, 12:00 pm, 6:00 pm)         I am focused on my pain, and I am not doing my daily activities.  I am groaning in pain, and I cannot sleep. I am unable to do anything.  My pain is as bad as it could be, and nothing else matters.  Non-Medication Therapies  +  Around-the-Clock Non-Opioid Medications  +  Short-acting opioids  Opioids should be used with other medications to manage severe pain. Opioids block pain and give a feeling of euphoria (feel high). Addiction, a serious side effect of opioids, is   rare with short-term (a few days) use.  Examples of short-acting opioids include: Tramadol (Ultram), Hydrocodone (Norco, Vicodin), Hydromorphone (Dilaudid), Oxycodone (Oxycontin)     The above directions have been adapted from the [employer] Surgical Patient Education Program.  Please refer to the ACS website if needed: [email or web address]://[email or web address].org/-/media/files/education/[email or web address]   Kekai Geter, MD [facility] [address], [address], [place], [place]  [address] ?  [address] [address], [place],    [address] [phone] ? [phone] ? FAX [phone] Web site: [email or web address]  

## 2021-06-09 NOTE — H&P (Signed)
   Admitting Physician: Hyman Hopes Sherlene Rickel  Service: General Surgery  CC: hernia  Subjective   PRX:YVOPFY Pitera is a 51 y.o. 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 He has noticed a hernia in his epigastric area. He has noticed it for a long time. It is recently gotten more painful and symptomatic. He had a really bad episode the other day which sent him home from work.  History reviewed. No pertinent past medical history.  Past Surgical History:  Procedure Laterality Date   ORIF ANKLE FRACTURE Left 08/14/2012   Procedure: OPEN REDUCTION INTERNAL FIXATION (ORIF) ANKLE FRACTURE;  Surgeon: Loanne Drilling, MD;  Location: [facility];  Service: Orthopedics;  Laterality: Left;    History reviewed. No pertinent family history.  Social:  reports that he quit smoking about 3 months ago. His smoking use included cigarettes. He has a 2.50 pack-year smoking history. He has never used smokeless tobacco. He reports current alcohol use of about 7.0 standard drinks per week. He reports current drug use. Frequency: 2.00 times per week. Drug: Marijuana.  Allergies: No Known Allergies  Medications: Current Outpatient Medications  Medication Instructions   acetaminophen (TYLENOL) 1,300 mg, Oral, Every 8 hours PRN    ROS - all of the below systems have been reviewed with the patient and positives are indicated with bold text General: chills, fever or night sweats Eyes: blurry vision or double vision ENT: epistaxis or sore throat Allergy/Immunology: itchy/watery eyes or nasal congestion Hematologic/Lymphatic: bleeding problems, blood clots or swollen lymph nodes Endocrine: temperature intolerance or unexpected weight changes Breast: new or changing breast lumps or nipple discharge Resp: cough, shortness of breath, or wheezing CV: chest pain or dyspnea on exertion GI: as per HPI GU: dysuria,  trouble voiding, or hematuria MSK: joint pain or joint stiffness Neuro: TIA or stroke symptoms Derm: pruritus and skin lesion changes Psych: anxiety and depression  Objective   PE Blood pressure (!) 141/80, pulse 65, temperature 99.1 F (37.3 C), temperature source Oral, resp. rate 16, height 5\' 8"  (1.727 m), weight 77.1 kg, SpO2 99 %. Constitutional: NAD; conversant; no deformities Eyes: Moist conjunctiva; no lid lag; anicteric; PERRL Neck: Trachea midline; no thyromegaly Lungs: Normal respiratory effort; no tactile fremitus CV: RRR; no palpable thrills; no pitting edema GI: Abd reducible epigastric, likely fat-containing hernia with approximately 1-1/2 cm round fascial defect.  MSK: Normal range of motion of extremities; no clubbing/cyanosis Psychiatric: Appropriate affect; alert and oriented x3 Lymphatic: No palpable cervical or axillary lymphadenopathy  No results found for this or any previous visit (from the past 24 hour(s)).  Imaging Orders  No imaging studies ordered today     Assessment and Plan  Mr. Cada has an epigastric hernia  ~1.5 cm x 1.5 cm  I recommended epigastric hernia repair open with mesh. The procedure itself as well as its risk, benefits, and alternatives were discussed with patient in full who granted consent to proceed. We will proceed as scheduled.    Nedra Hai, MD  [facility] Use [email or web address] to contact on call provider

## 2021-06-09 NOTE — Anesthesia Preprocedure Evaluation (Signed)
Anesthesia Evaluation  Patient identified by MRN, date of birth, ID band Patient awake    Reviewed: Allergy & Precautions, NPO status , Patient's Chart, lab work & pertinent test results  History of Anesthesia Complications Negative for: history of anesthetic complications  Airway Mallampati: I  TM Distance: >3 FB Neck ROM: Full    Dental  (+) Dental Advisory Given, Teeth Intact   Pulmonary neg pulmonary ROS, former smoker,    Pulmonary exam normal        Cardiovascular negative cardio ROS Normal cardiovascular exam     Neuro/Psych negative neurological ROS     GI/Hepatic Neg liver ROS, hiatal hernia,   Endo/Other  negative endocrine ROS  Renal/GU negative Renal ROS  negative genitourinary   Musculoskeletal negative musculoskeletal ROS (+)   Abdominal   Peds  Hematology negative hematology ROS (+)   Anesthesia Other Findings   Reproductive/Obstetrics                             Anesthesia Physical Anesthesia Plan  ASA: 2  Anesthesia Plan: General   Post-op Pain Management: Tylenol PO (pre-op)*, Celebrex PO (pre-op)* and Gabapentin PO (pre-op)*   Induction: Intravenous  PONV Risk Score and Plan: 2 and Ondansetron, Dexamethasone, Treatment may vary due to age or medical condition and Midazolam  Airway Management Planned: Oral ETT  Additional Equipment: None  Intra-op Plan:   Post-operative Plan: Extubation in OR  Informed Consent: I have reviewed the patients History and Physical, chart, labs and discussed the procedure including the risks, benefits and alternatives for the proposed anesthesia with the patient or authorized representative who has indicated his/her understanding and acceptance.     Dental advisory given  Plan Discussed with:   Anesthesia Plan Comments:         Anesthesia Quick Evaluation

## 2021-06-10 ENCOUNTER — Encounter (HOSPITAL_COMMUNITY): Payer: Self-pay | Admitting: Surgery

## 2021-10-03 DIAGNOSIS — D128 Benign neoplasm of rectum: Secondary | ICD-10-CM | POA: Diagnosis not present

## 2021-10-03 DIAGNOSIS — K5730 Diverticulosis of large intestine without perforation or abscess without bleeding: Secondary | ICD-10-CM | POA: Diagnosis not present

## 2021-10-03 DIAGNOSIS — K648 Other hemorrhoids: Secondary | ICD-10-CM | POA: Diagnosis not present

## 2021-10-03 DIAGNOSIS — Z1211 Encounter for screening for malignant neoplasm of colon: Secondary | ICD-10-CM | POA: Diagnosis not present

## 2021-10-24 DIAGNOSIS — R69 Illness, unspecified: Secondary | ICD-10-CM | POA: Diagnosis not present

## 2021-10-31 DIAGNOSIS — R69 Illness, unspecified: Secondary | ICD-10-CM | POA: Diagnosis not present

## 2021-11-03 DIAGNOSIS — R69 Illness, unspecified: Secondary | ICD-10-CM | POA: Diagnosis not present

## 2021-11-05 DIAGNOSIS — R69 Illness, unspecified: Secondary | ICD-10-CM | POA: Diagnosis not present

## 2021-11-07 DIAGNOSIS — R69 Illness, unspecified: Secondary | ICD-10-CM | POA: Diagnosis not present

## 2021-12-04 DIAGNOSIS — R69 Illness, unspecified: Secondary | ICD-10-CM | POA: Diagnosis not present

## 2021-12-11 DIAGNOSIS — R69 Illness, unspecified: Secondary | ICD-10-CM | POA: Diagnosis not present

## 2021-12-18 DIAGNOSIS — R69 Illness, unspecified: Secondary | ICD-10-CM | POA: Diagnosis not present

## 2021-12-25 DIAGNOSIS — R69 Illness, unspecified: Secondary | ICD-10-CM | POA: Diagnosis not present

## 2022-01-01 DIAGNOSIS — R69 Illness, unspecified: Secondary | ICD-10-CM | POA: Diagnosis not present

## 2022-01-05 DIAGNOSIS — Z8249 Family history of ischemic heart disease and other diseases of the circulatory system: Secondary | ICD-10-CM | POA: Diagnosis not present

## 2022-01-05 DIAGNOSIS — Z833 Family history of diabetes mellitus: Secondary | ICD-10-CM | POA: Diagnosis not present

## 2022-01-05 DIAGNOSIS — Z720 Tobacco use: Secondary | ICD-10-CM | POA: Diagnosis not present

## 2022-01-08 DIAGNOSIS — R69 Illness, unspecified: Secondary | ICD-10-CM | POA: Diagnosis not present

## 2022-01-14 DIAGNOSIS — R69 Illness, unspecified: Secondary | ICD-10-CM | POA: Diagnosis not present

## 2022-01-15 DIAGNOSIS — R69 Illness, unspecified: Secondary | ICD-10-CM | POA: Diagnosis not present

## 2022-01-21 DIAGNOSIS — R69 Illness, unspecified: Secondary | ICD-10-CM | POA: Diagnosis not present

## 2022-01-23 DIAGNOSIS — F1020 Alcohol dependence, uncomplicated: Secondary | ICD-10-CM | POA: Diagnosis not present

## 2022-01-26 DIAGNOSIS — F1020 Alcohol dependence, uncomplicated: Secondary | ICD-10-CM | POA: Diagnosis not present

## 2022-01-27 DIAGNOSIS — F1020 Alcohol dependence, uncomplicated: Secondary | ICD-10-CM | POA: Diagnosis not present

## 2022-01-28 DIAGNOSIS — F1020 Alcohol dependence, uncomplicated: Secondary | ICD-10-CM | POA: Diagnosis not present

## 2022-01-29 DIAGNOSIS — F1020 Alcohol dependence, uncomplicated: Secondary | ICD-10-CM | POA: Diagnosis not present

## 2022-01-30 DIAGNOSIS — F1020 Alcohol dependence, uncomplicated: Secondary | ICD-10-CM | POA: Diagnosis not present

## 2022-02-16 DIAGNOSIS — F1020 Alcohol dependence, uncomplicated: Secondary | ICD-10-CM | POA: Diagnosis not present

## 2022-02-16 DIAGNOSIS — R69 Illness, unspecified: Secondary | ICD-10-CM | POA: Diagnosis not present

## 2022-02-17 DIAGNOSIS — F1020 Alcohol dependence, uncomplicated: Secondary | ICD-10-CM | POA: Diagnosis not present

## 2022-02-18 DIAGNOSIS — F1020 Alcohol dependence, uncomplicated: Secondary | ICD-10-CM | POA: Diagnosis not present

## 2022-02-25 DIAGNOSIS — F1020 Alcohol dependence, uncomplicated: Secondary | ICD-10-CM | POA: Diagnosis not present

## 2022-02-27 DIAGNOSIS — F1020 Alcohol dependence, uncomplicated: Secondary | ICD-10-CM | POA: Diagnosis not present

## 2022-03-02 DIAGNOSIS — F1020 Alcohol dependence, uncomplicated: Secondary | ICD-10-CM | POA: Diagnosis not present

## 2022-03-03 DIAGNOSIS — F1020 Alcohol dependence, uncomplicated: Secondary | ICD-10-CM | POA: Diagnosis not present

## 2022-03-04 DIAGNOSIS — F1020 Alcohol dependence, uncomplicated: Secondary | ICD-10-CM | POA: Diagnosis not present

## 2022-03-06 DIAGNOSIS — F1020 Alcohol dependence, uncomplicated: Secondary | ICD-10-CM | POA: Diagnosis not present

## 2022-03-09 DIAGNOSIS — F1020 Alcohol dependence, uncomplicated: Secondary | ICD-10-CM | POA: Diagnosis not present

## 2022-03-11 DIAGNOSIS — F1020 Alcohol dependence, uncomplicated: Secondary | ICD-10-CM | POA: Diagnosis not present

## 2022-03-17 DIAGNOSIS — F1020 Alcohol dependence, uncomplicated: Secondary | ICD-10-CM | POA: Diagnosis not present

## 2022-04-15 DIAGNOSIS — F1020 Alcohol dependence, uncomplicated: Secondary | ICD-10-CM | POA: Diagnosis not present

## 2022-04-20 DIAGNOSIS — F1020 Alcohol dependence, uncomplicated: Secondary | ICD-10-CM | POA: Diagnosis not present

## 2022-04-21 DIAGNOSIS — F1020 Alcohol dependence, uncomplicated: Secondary | ICD-10-CM | POA: Diagnosis not present

## 2022-04-22 DIAGNOSIS — F1020 Alcohol dependence, uncomplicated: Secondary | ICD-10-CM | POA: Diagnosis not present

## 2022-04-23 DIAGNOSIS — F1020 Alcohol dependence, uncomplicated: Secondary | ICD-10-CM | POA: Diagnosis not present

## 2022-04-24 DIAGNOSIS — F1020 Alcohol dependence, uncomplicated: Secondary | ICD-10-CM | POA: Diagnosis not present

## 2022-08-22 DIAGNOSIS — Z720 Tobacco use: Secondary | ICD-10-CM | POA: Diagnosis not present

## 2022-08-22 DIAGNOSIS — R030 Elevated blood-pressure reading, without diagnosis of hypertension: Secondary | ICD-10-CM | POA: Diagnosis not present

## 2022-08-22 DIAGNOSIS — Z91199 Patient's noncompliance with other medical treatment and regimen due to unspecified reason: Secondary | ICD-10-CM | POA: Diagnosis not present
